# Patient Record
Sex: Male | Born: 1935 | Race: White | Hispanic: No | Marital: Single | Smoking: Former smoker
Health system: Southern US, Community
[De-identification: ages and names within clinical notes are randomized; demographics above are authoritative.]

## PROBLEM LIST (undated history)

## (undated) DIAGNOSIS — T8859XA Other complications of anesthesia, initial encounter: Secondary | ICD-10-CM

## (undated) DIAGNOSIS — N400 Enlarged prostate without lower urinary tract symptoms: Secondary | ICD-10-CM

## (undated) DIAGNOSIS — T4145XA Adverse effect of unspecified anesthetic, initial encounter: Secondary | ICD-10-CM

## (undated) DIAGNOSIS — T7840XA Allergy, unspecified, initial encounter: Secondary | ICD-10-CM

## (undated) DIAGNOSIS — I10 Essential (primary) hypertension: Secondary | ICD-10-CM

## (undated) DIAGNOSIS — J40 Bronchitis, not specified as acute or chronic: Secondary | ICD-10-CM

## (undated) DIAGNOSIS — E039 Hypothyroidism, unspecified: Secondary | ICD-10-CM

## (undated) DIAGNOSIS — H547 Unspecified visual loss: Secondary | ICD-10-CM

## (undated) HISTORY — PX: ELBOW FRACTURE SURGERY: SHX616

## (undated) HISTORY — DX: Hypothyroidism, unspecified: E03.9

## (undated) HISTORY — PX: RETINAL DETACHMENT SURGERY: SHX105

## (undated) HISTORY — DX: Benign prostatic hyperplasia without lower urinary tract symptoms: N40.0

## (undated) HISTORY — DX: Essential (primary) hypertension: I10

## (undated) HISTORY — PX: EYE SURGERY: SHX253

---

## 1999-04-27 HISTORY — PX: CATARACT EXTRACTION W/ INTRAOCULAR LENS  IMPLANT, BILATERAL: SHX1307

## 2000-07-06 ENCOUNTER — Encounter: Admission: RE | Admit: 2000-07-06 | Discharge: 2000-07-06 | Payer: Self-pay | Admitting: Family Medicine

## 2000-07-06 ENCOUNTER — Encounter: Payer: Self-pay | Admitting: Family Medicine

## 2001-08-28 ENCOUNTER — Encounter: Admission: RE | Admit: 2001-08-28 | Discharge: 2001-08-28 | Payer: Self-pay | Admitting: Family Medicine

## 2001-08-28 ENCOUNTER — Encounter: Payer: Self-pay | Admitting: Family Medicine

## 2007-09-04 ENCOUNTER — Encounter: Admission: RE | Admit: 2007-09-04 | Discharge: 2007-09-04 | Payer: Self-pay | Admitting: Family Medicine

## 2009-06-09 ENCOUNTER — Ambulatory Visit: Payer: Self-pay | Admitting: Family Medicine

## 2009-06-09 DIAGNOSIS — I10 Essential (primary) hypertension: Secondary | ICD-10-CM | POA: Insufficient documentation

## 2009-06-09 DIAGNOSIS — M109 Gout, unspecified: Secondary | ICD-10-CM | POA: Insufficient documentation

## 2009-06-10 ENCOUNTER — Telehealth: Payer: Self-pay | Admitting: Family Medicine

## 2009-06-10 LAB — CONVERTED CEMR LAB
ALT: 17 units/L (ref 0–53)
AST: 15 units/L (ref 0–37)
Albumin: 4.3 g/dL (ref 3.5–5.2)
Alkaline Phosphatase: 74 units/L (ref 39–117)
BUN: 17 mg/dL (ref 6–23)
CO2: 26 meq/L (ref 19–32)
Calcium: 9 mg/dL (ref 8.4–10.5)
Chloride: 103 meq/L (ref 96–112)
Cholesterol: 160 mg/dL (ref 0–200)
Creatinine, Ser: 1.14 mg/dL (ref 0.40–1.50)
Glucose, Bld: 89 mg/dL (ref 70–99)
HDL: 46 mg/dL (ref >39)
LDL Cholesterol: 103 mg/dL — ABNORMAL HIGH (ref 0–99)
PSA: 0.84 ng/mL (ref 0.10–4.00)
Potassium: 4.3 meq/L (ref 3.5–5.3)
Sodium: 143 meq/L (ref 135–145)
TSH: 0.808 microintl units/mL (ref 0.350–4.500)
Total Bilirubin: 0.4 mg/dL (ref 0.3–1.2)
Total CHOL/HDL Ratio: 3.5
Total Protein: 6.4 g/dL (ref 6.0–8.3)
Triglycerides: 54 mg/dL (ref <150)
Uric Acid, Serum: 9.8 mg/dL — ABNORMAL HIGH (ref 4.0–7.8)
VLDL: 11 mg/dL (ref 0–40)
Vit D, 25-Hydroxy: 18 ng/mL — ABNORMAL LOW (ref 30–89)
Vitamin B-12: 356 pg/mL (ref 211–911)

## 2009-06-30 ENCOUNTER — Ambulatory Visit: Payer: Self-pay | Admitting: Family Medicine

## 2009-08-27 ENCOUNTER — Telehealth (INDEPENDENT_AMBULATORY_CARE_PROVIDER_SITE_OTHER): Payer: Self-pay | Admitting: *Deleted

## 2009-08-30 ENCOUNTER — Encounter: Payer: Self-pay | Admitting: Family Medicine

## 2009-09-01 LAB — CONVERTED CEMR LAB: Vit D, 25-Hydroxy: 38 ng/mL (ref 30–89)

## 2009-11-10 ENCOUNTER — Ambulatory Visit: Payer: Self-pay | Admitting: Family Medicine

## 2009-11-11 LAB — CONVERTED CEMR LAB
BUN: 22 mg/dL (ref 6–23)
CO2: 27 meq/L (ref 19–32)
Calcium: 9.3 mg/dL (ref 8.4–10.5)
Chloride: 104 meq/L (ref 96–112)
Creatinine, Ser: 1.49 mg/dL (ref 0.40–1.50)
Glucose, Bld: 95 mg/dL (ref 70–99)
Potassium: 4.3 meq/L (ref 3.5–5.3)
Sodium: 141 meq/L (ref 135–145)
TSH: 1.357 microintl units/mL (ref 0.350–4.500)
Uric Acid, Serum: 10.5 mg/dL — ABNORMAL HIGH (ref 4.0–7.8)

## 2009-11-14 ENCOUNTER — Ambulatory Visit: Payer: Self-pay | Admitting: Family Medicine

## 2009-11-14 DIAGNOSIS — N139 Obstructive and reflux uropathy, unspecified: Secondary | ICD-10-CM | POA: Insufficient documentation

## 2009-11-14 LAB — CONVERTED CEMR LAB
Bilirubin Urine: NEGATIVE
Blood in Urine, dipstick: NEGATIVE
Glucose, Urine, Semiquant: NEGATIVE
Ketones, urine, test strip: NEGATIVE
Nitrite: NEGATIVE
Specific Gravity, Urine: 1.015
Urobilinogen, UA: 0.2
WBC Urine, dipstick: NEGATIVE
pH: 5

## 2009-11-15 ENCOUNTER — Encounter: Payer: Self-pay | Admitting: Family Medicine

## 2009-11-24 ENCOUNTER — Ambulatory Visit: Payer: Self-pay | Admitting: Family Medicine

## 2009-11-25 DIAGNOSIS — N183 Chronic kidney disease, stage 3 unspecified: Secondary | ICD-10-CM | POA: Insufficient documentation

## 2009-11-25 LAB — CONVERTED CEMR LAB
BUN: 19 mg/dL (ref 6–23)
CO2: 29 meq/L (ref 19–32)
Calcium: 8.9 mg/dL (ref 8.4–10.5)
Chloride: 100 meq/L (ref 96–112)
Creatinine, Ser: 1.65 mg/dL — ABNORMAL HIGH (ref 0.40–1.50)
Glucose, Bld: 118 mg/dL — ABNORMAL HIGH (ref 70–99)
Potassium: 3.8 meq/L (ref 3.5–5.3)
Sodium: 140 meq/L (ref 135–145)

## 2009-11-26 ENCOUNTER — Telehealth (INDEPENDENT_AMBULATORY_CARE_PROVIDER_SITE_OTHER): Payer: Self-pay | Admitting: *Deleted

## 2009-11-26 ENCOUNTER — Encounter: Payer: Self-pay | Admitting: Family Medicine

## 2009-11-27 ENCOUNTER — Telehealth (INDEPENDENT_AMBULATORY_CARE_PROVIDER_SITE_OTHER): Payer: Self-pay | Admitting: *Deleted

## 2009-12-01 ENCOUNTER — Emergency Department (HOSPITAL_COMMUNITY)
Admission: EM | Admit: 2009-12-01 | Discharge: 2009-12-01 | Payer: Self-pay | Source: Home / Self Care | Admitting: Emergency Medicine

## 2009-12-05 ENCOUNTER — Telehealth (INDEPENDENT_AMBULATORY_CARE_PROVIDER_SITE_OTHER): Payer: Self-pay | Admitting: *Deleted

## 2009-12-05 ENCOUNTER — Ambulatory Visit: Payer: Self-pay | Admitting: Family Medicine

## 2009-12-05 DIAGNOSIS — N138 Other obstructive and reflux uropathy: Secondary | ICD-10-CM | POA: Insufficient documentation

## 2009-12-05 DIAGNOSIS — N401 Enlarged prostate with lower urinary tract symptoms: Secondary | ICD-10-CM

## 2009-12-08 ENCOUNTER — Encounter: Payer: Self-pay | Admitting: Family Medicine

## 2009-12-12 ENCOUNTER — Ambulatory Visit: Payer: Self-pay | Admitting: Family Medicine

## 2009-12-12 DIAGNOSIS — E538 Deficiency of other specified B group vitamins: Secondary | ICD-10-CM

## 2009-12-26 ENCOUNTER — Ambulatory Visit
Admission: RE | Admit: 2009-12-26 | Discharge: 2009-12-26 | Payer: Self-pay | Source: Home / Self Care | Admitting: Urology

## 2009-12-26 ENCOUNTER — Ambulatory Visit: Payer: Self-pay | Admitting: Vascular Surgery

## 2009-12-31 ENCOUNTER — Telehealth: Payer: Self-pay | Admitting: Family Medicine

## 2010-05-26 NOTE — Progress Notes (Signed)
Summary: renal u/s  Phone Note Outgoing Call   Summary of Call: Pls let pt know that his renal u/s shows a small kidney syst with normal sized kidneys and a mildly enlarged prostate gland.  Will fax copy to urology for upcoming visit.   Initial call taken by: [name],  November 26, 2009 3:50 PM  Follow-up for Phone Call        [facility] informing Pt of the above Follow-up by: [name],  November 26, 2009 3:58 PM

## 2010-05-26 NOTE — Assessment & Plan Note (Signed)
Summary: NOV HTN/ labs   Vital Signs:  Patient profile:   77 year old male Height:      67 inches Weight:      188 pounds BMI:     29.55 O2 Sat:      97 % on Room air Temp:     97.7 degrees F oral Pulse rate:   82 / minute BP sitting:   145 / 89  (left arm) Cuff size:   regular  Vitals Entered By: [name] (June 09, 2009 10:37 AM)  O2 Flow:  Room air CC: New to est.    Primary Care Provider:  [name]  CC:  New to est. .  History of Present Illness: 77 yo WM presents for NOV.  He has a hx of HTN and hypothyroidism x 20 yrs.  He is doing well on Armour Thyroid.  His TSH is due.  He takes Lasix (branded) for leg swelling as needed.  No hx of CHF.  He needs RF on his AtacandHCT.  Denies any heart problems.  His fasting labs are due.  He has hx of low B12 and low Vit D.      Current Medications (verified): 1)  Armour Thyroid 60 Mg Tabs (Thyroid) .... Take 1 Tab By Mouth Once Daily 2)  Lasix 40 Mg Tabs (Furosemide) .... Take As Directed As Needed 3)  Atacand Hct 32-12.5 Mg Tabs (Candesartan Cilexetil-Hctz) .... Take 1 Tab By Mouth Once Daily 4)  Potassium Chloride Cr 10 Meq Cr-Tabs (Potassium Chloride) .... Take As Directed As Needed W/ Lasix  Allergies (verified): 1)  ! * Ivp Dye  Past History:  Past Medical History: hypothyroidism HTN  Past Surgical History: ruptured viscous bilat 1994, 2001  Family History: brother healthy sister DM father and mother died of heart dz > 13 yo.  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 Denies ETOH. No regular exercise.   Review of Systems       no fevers/sweats/weakness, unexplained wt loss/gain, no change in vision, no difficulty hearing, ringing in ears, no hay fever/allergies, no CP/discomfort, no palpitations, no breast lump/nipple discharge, no cough/wheeze, no blood in stool, no N/V/D, no nocturia, no leaking urine, no unusual vag bleeding, no  vaginal/penile discharge, no muscle/joint pain, no rash, no new/changing mole, no HA, no memory loss, no anxiety, no sleep problem, no depression, no unexplained lumps, no easy bruising/bleeding, no concern with sexual function.   Physical Exam  General:  alert, well-developed, well-nourished, and well-hydrated.   Head:  normocephalic and atraumatic.   Eyes:  s/p surgical lens implant Ears:  no external deformities.   Nose:  no nasal discharge.   Mouth:  pharynx pink and moist and fair dentition.   Neck:  no masses.   Lungs:  Normal respiratory effort, chest expands symmetrically. Lungs are clear to auscultation, no crackles or wheezes. Heart:  normal rate, regular rhythm, and no murmur.   Abdomen:  soft, non-tender, and normal bowel sounds. no AA bruits Extremities:  1+ pitting LE edema bilat Skin:  color normal.   Cervical Nodes:  No lymphadenopathy noted Psych:  good eye contact, not anxious appearing, and not depressed appearing.     Impression & Recommendations:  Problem # 1:  ESSENTIAL HYPERTENSION, BENIGN (ICD-401.1) BP high today but he claims to be 'at goal' at home.  Continue current meds.  He takes Lasix only as needed for leg swelling. Will get old  records to see if he has had an ECHO. His updated medication list for this problem includes:    Lasix 40 Mg Tabs (Furosemide) .Marland Kitchen... Take as directed as needed    Atacand Hct 32-12.5 Mg Tabs (Candesartan cilexetil-hctz) .Marland Kitchen... Take 1 tab by mouth once daily  Orders: T-Comprehensive Metabolic Panel ([id number])  BP today: 145/89  Problem # 2:  UNSPECIFIED HYPOTHYROIDISM (ICD-244.9) Update TSH and adjust and RF thyroid meds tomorrow. His updated medication list for this problem includes:    Armour Thyroid 60 Mg Tabs (Thyroid) .Marland Kitchen... Take 1 tab by mouth once daily  Orders: T-TSH ([id number])  Problem # 3:  FATIGUE (ICD-780.79) Update labs today. Orders: T-Vitamin B12 [id number]) T-Vitamin D (25-Hydroxy)  [id number])  Problem # 4:  GOUT, UNSPECIFIED (ICD-274.9) Hx of gout but declined going back on Allopurinol.   Orders: T-Uric Acid (Blood) [id number])  Complete Medication List: 1)  Armour Thyroid 60 Mg Tabs (Thyroid) .... Take 1 tab by mouth once daily 2)  Lasix 40 Mg Tabs (Furosemide) .... Take as directed as needed 3)  Atacand Hct 32-12.5 Mg Tabs (Candesartan cilexetil-hctz) .... Take 1 tab by mouth once daily 4)  Potassium Chloride Cr 10 Meq Cr-tabs (Potassium chloride) .... Take as directed as needed w/ lasix  Other Orders: T-PSA Total (Medicare Screen Only) [id number]) T-Lipid Profile [id number])  Patient Instructions: 1)  Labs today. 2)  Will call you w/ results tomorrow. 3)  Atacand RFd. 4)  Follow up in 6 mos. Prescriptions: ATACAND HCT 32-12.5 MG TABS (CANDESARTAN CILEXETIL-HCTZ) Take 1 tab by mouth once daily  #30 x 3   Entered and Authorized by:   [name]   Signed by:   [name] on 06/09/2009   Method used:   Electronically to        [facility]* (retail)       [address]       [place], [place]  [address]       Ph: [phone]       Fax: [phone]   RxID:   [id number]

## 2010-05-26 NOTE — Assessment & Plan Note (Signed)
Summary: TROUBLE URINATING/   Vital Signs:  Patient profile:   77 year old male Height:      67 inches Weight:      185 pounds BMI:     29.08 O2 Sat:      96 % on Room air Pulse rate:   89 / minute BP sitting:   101 / 62  (left arm) Cuff size:   large  Vitals Entered By: [name] (November 14, 2009 2:52 PM)  O2 Flow:  Room air CC: Unable to urinate well x 4 days.    Primary Care Provider:  [name]  CC:  Unable to urinate well x 4 days. Marland Kitchen  History of Present Illness: 77 yo WM presents for problems getting urine to pass over the past 4 days, getting worse.  He has tried to take Lasix but that didn't help either.  His last output was this AM.  He has voided only a tiny amount first thing this AM.      He does not feel like he has to void today.  He has been drinking cranberry juice, water and lemonade.  He has no swelling in his legs.  He is not taking any cold or allergy meds.  Denies any abdominal pain.      Current Medications (verified): 1)  Armour Thyroid 60 Mg Tabs (Thyroid) .... Take 1 Tab By Mouth Once Daily 2)  Lasix 40 Mg Tabs (Furosemide) .... Take As Directed As Needed 3)  Losartan Potassium-Hctz 100-12.5 Mg Tabs (Losartan Potassium-Hctz) .Marland Kitchen.. 1 Tab By Mouth Daily 4)  Potassium Chloride Cr 10 Meq Cr-Tabs (Potassium Chloride) .... Take As Directed As Needed W/ Lasix  Allergies (verified): 1)  ! * Ivp Dye  Past History:  Past Medical History: Reviewed history from 11/10/2009 and no changes required. hypothyroidism HTN gout  Past Surgical History: Reviewed history from 06/09/2009 and no changes required. ruptured viscous bilat 1994, 2001  Family History: Reviewed history from 06/09/2009 and no changes required. brother healthy sister DM father and mother died of heart dz > 73 yo.  Social History: Reviewed history from 06/09/2009 and no changes required. 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 Denies  ETOH. No regular exercise.   Review of Systems      See HPI  Physical Exam  General:  alert, well-developed, well-nourished, and well-hydrated.   Head:  normocephalic and atraumatic.   Eyes:  conjunctiva clear Neck:  no masses.   Lungs:  normal respiratory effort, no intercostal retractions, and no accessory muscle use.   Heart:  normal rate, regular rhythm, and no murmur.   Abdomen:  soft, non-tender, normal bowel sounds, no distention, and no guarding.  no CVAT Skin:  color normal.   Psych:  good eye contact, not anxious appearing, and not depressed appearing.     Impression & Recommendations:  Problem # 1:  URINARY OBSTRUCTION UNSPECIFIED (ICD-599.60) I&O cath performed to empty bladder (performed by Urgent care nursing staff).  Urine specimen collected for UA and cx. Will empirically start on Cipro + Flomax for BPH.  he is not taking any meds with anti cholinergic SEs.  He is to stop the Lasix as his BP is a little low and he is probably dehydrated.  Work on increasing water intake and RTC in 1 wk to recheck.  His lst Cr was 1.49.  Will need this repeated.   Orders: Urinalysis-dipstick only (Medicare patient) [id number]) T-Culture, Urine ([id number])  Complete Medication List: 1)  Armour Thyroid 60 Mg Tabs (Thyroid) .... Take 1 tab by mouth once daily 2)  Lasix 40 Mg Tabs (Furosemide) .... Take as directed as needed 3)  Losartan Potassium-hctz 100-12.5 Mg Tabs (Losartan potassium-hctz) .Marland Kitchen.. 1 tab by mouth daily 4)  Potassium Chloride Cr 10 Meq Cr-tabs (Potassium chloride) .... Take as directed as needed w/ lasix 5)  Flomax 0.4 Mg Caps (Tamsulosin hcl) .Marland Kitchen.. 1 capsule by mouth qpm 6)  Cipro 500 Mg Tabs (Ciprofloxacin hcl) .Marland Kitchen.. 1 tab by mouth two times a day x 7 days  Patient Instructions: 1)  For urinary obstruction: 2)  Take Cipro 500 mg 2 x a day x 7 days. 3)  Use Flomax 1 tab in the evening. 4)  Will call you with urine culture results next wk. 5)  Return for prostate  check/ BMP in 5 days. Prescriptions: CIPRO 500 MG TABS (CIPROFLOXACIN HCL) 1 tab by mouth two times a day x 7 days  #14 x 0   Entered and Authorized by:   [name]   Signed by:   [name] on 11/14/2009   Method used:   Electronically to        [facility]* (retail)       [address]       [place], [place]  [address]       Ph: [phone]       Fax: [phone]   RxID:   [id number] FLOMAX 0.4 MG CAPS (TAMSULOSIN HCL) 1 capsule by mouth qPM  #30 x 0   Entered and Authorized by:   [name]   Signed by:   [name] on 11/14/2009   Method used:   Electronically to        [facility]* (retail)       [address]       [place], [place]  [address]       Ph: [phone]       Fax: [phone]   RxID:   [id number] CIPRO 500 MG TABS (CIPROFLOXACIN HCL) 1 tab by mouth two times a day x 7 days  #14 x 0   Entered and Authorized by:   [name]   Signed by:   [name] on 11/14/2009   Method used:   Electronically to        [facility]* (retail)       [address]       [place], [place]  [address]       Ph: [phone]       Fax: [phone]   RxID:   [id number] FLOMAX 0.4 MG CAPS (TAMSULOSIN HCL) 1 capsule by mouth qPM  #30 x 2   Entered and Authorized by:   [name]   Signed by:   [name] on 11/14/2009   Method used:   Electronically to        [facility]* (retail)       [address]       [place], [place]  [address]       Ph: [phone]       Fax: [phone]   RxID:   [id number]   Laboratory Results   Urine Tests  Date/Time Received: 11/14/2009 Date/Time Reported: 11/14/2009  Routine Urinalysis   Color: yellow Appearance: Clear Glucose: negative   (Normal Range: Negative) Bilirubin: negative   (Normal Range: Negative) Ketone: negative   (Normal Range: Negative) Spec. Gravity: 1.015   (  Normal Range: 1.003-1.035) Blood: negative   (Normal Range: Negative) pH: 5.0   (Normal Range:  5.0-8.0) Protein: trace   (Normal Range: Negative) Urobilinogen: 0.2   (Normal Range: 0-1) Nitrite: negative   (Normal Range: Negative) Leukocyte Esterace: negative   (Normal Range: Negative)

## 2010-05-26 NOTE — Assessment & Plan Note (Signed)
Summary: BP check  Nurse Visit   Vital Signs:  Patient profile:   77 year old male O2 Sat:      99 % on Room air Pulse rate:   87 / minute BP sitting:   119 / 77  (left arm) Cuff size:   regular  O2 Flow:  Room air  Allergies: 1)  ! * Ivp Dye  Orders Added: 1)  Est. Patient Level I [[address]]   CC: Follow-up HTN HPI: Taking meds? yes   Side effects? no Chest pain, SOB, Dizziness? no A/P: HTN (401.1) At goal? If no, Patient will be notified.  5 minutes was spent with patient.   Impression & Recommendations:  Problem # 1:  ESSENTIAL HYPERTENSION, BENIGN (ICD-401.1) Assessment Improved Continue current meds. His updated medication list for this problem includes:    Lasix 40 Mg Tabs (Furosemide) .Marland Kitchen... Take as directed as needed    Losartan Potassium-hctz 100-12.5 Mg Tabs (Losartan potassium-hctz) .Marland Kitchen... 1 tab by mouth daily  Orders: Est. Patient Level I ([address])  BP today: 119/77 Prior BP: 145/89 (06/09/2009)  Labs Reviewed: K+: 4.3 (06/09/2009) Creat: : 1.14 (06/09/2009)   Chol: 160 (06/09/2009)   HDL: 46 (06/09/2009)   LDL: 103 (06/09/2009)   TG: 54 (06/09/2009)  Complete Medication List: 1)  Armour Thyroid 60 Mg Tabs (Thyroid) .... Take 1 tab by mouth once daily 2)  Lasix 40 Mg Tabs (Furosemide) .... Take as directed as needed 3)  Losartan Potassium-hctz 100-12.5 Mg Tabs (Losartan potassium-hctz) .Marland Kitchen.. 1 tab by mouth daily 4)  Potassium Chloride Cr 10 Meq Cr-tabs (Potassium chloride) .... Take as directed as needed w/ lasix 5)  Vitamin D 50,000 Iu  .Marland Kitchen.. 1 capsule by mouth once a wk

## 2010-05-26 NOTE — Progress Notes (Signed)
Summary: Atacand too expensive  Phone Note Call from Patient   Caller: Patient Summary of Call: Pt can not afford Atacand. Pt would like changed. Please advise.  Initial call taken by: [name],  June 10, 2009 10:13 AM  Follow-up for Phone Call        changed to Losartan - HCTZ. REturn for a nurse BP check in 3 wks. Follow-up by: [name],  June 10, 2009 10:40 AM    New/Updated Medications: LOSARTAN POTASSIUM-HCTZ 100-12.5 MG TABS (LOSARTAN POTASSIUM-HCTZ) 1 tab by mouth daily Prescriptions: LOSARTAN POTASSIUM-HCTZ 100-12.5 MG TABS (LOSARTAN POTASSIUM-HCTZ) 1 tab by mouth daily  #30 x 1   Entered and Authorized by:   [name]   Signed by:   [name] on 06/10/2009   Method used:   Electronically to        [facility]* (retail)       [address]       [place], [place]  [address]       Ph: [phone]       Fax: [phone]   RxID:   [id number]   Appended Document: Atacand too expensive Pt aware. Apt scheduled

## 2010-05-26 NOTE — Assessment & Plan Note (Signed)
Summary: BPH f/u   Vital Signs:  Patient profile:   77 year old male Height:      67 inches Weight:      188 pounds BMI:     29.55 O2 Sat:      96 % on Room air Pulse rate:   95 / minute BP sitting:   132 / 76  (left arm) Cuff size:   large  Vitals Entered By: [name] (November 24, 2009 10:14 AM)  O2 Flow:  Room air CC: F/U prostate. Also c/o congestion and cough x 1 week.   Primary Care Provider:  [name]  CC:  F/U prostate. Also c/o congestion and cough x 1 week.Brent Morrison  History of Present Illness: 77 yo WM presents for f/u voiding problems from 1 wk ago from BPH.  He filled #8 of Flomax which did help.  He did take Cipro even though his Urine Cx was negative.    He has been able to void without problems since his visit although he is taking Lasix.  he has had some leg swelling.    He has had some chest congestion for a few days.  Denies any sore throat or runny nose.  Not taking anything.  Denies any SOB.  Cough is mostly dry.  He is not taking anything OTC.       Allergies (verified): 1)  ! * Ivp Dye  Past History:  Past Medical History: Reviewed history from 11/10/2009 and no changes required. hypothyroidism HTN gout  Past Surgical History: Reviewed history from 06/09/2009 and no changes required. ruptured viscous bilat 1994, 2001  Social History: Reviewed history from 06/09/2009 and no changes required. 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 Denies ETOH. No regular exercise.   Review of Systems      See HPI  Physical Exam  General:  alert, well-developed, well-nourished, and well-hydrated.   Head:  normocephalic and atraumatic.   Eyes:  sclera non icteric Mouth:  pharynx pink and moist.   Neck:  no masses.   Lungs:  normal respiratory effort, no intercostal retractions, and no accessory muscle use.  dry hacking cough Heart:  normal rate, regular rhythm, and no murmur.   Prostate:  no nodules, no  asymmetry, boggy, and 2+ enlarged.nontender   Skin:  color normal.   Psych:  good eye contact, not anxious appearing, and not depressed appearing.     Impression & Recommendations:  Problem # 1:  URINARY OBSTRUCTION UNSPECIFIED (ICD-599.60) Assessment Improved Needing I&O cath last wk.  Urine cx was neg.  Prostate exam today - 2+ enlarged, non tender.  Had a normal PSA this year. Will start him on Cardura instead of Flomax due to cost.  This should also help his BP. Avoid anticholinergics and avoid overuse of Lasix to "force urination' which he has been doing. He may have experienced dehydration with a drop in SBP which lead to anuric state last wk. Refer to urology if this recurs.  Problem # 2:  CHRONIC KIDNEY DISEASE STAGE II (MILD) (ICD-585.2) Cr 1.49 2 wks ago likely due to HTN and use of HCTZ with Lasix.  I stopped his HCTZ and recommended alternative treatments for leg edema such has elevation and compression socks.   Orders: T-Basic Metabolic Panel [id number])  Labs Reviewed: BUN: 22 (11/10/2009)   Cr: 1.49 (11/10/2009)    Ca++: 9.3 (11/10/2009)    TP: 6.4 (06/09/2009)   Alb: 4.3 (06/09/2009)  Problem # 3:  ACUTE BRONCHITIS (ICD-466.0) Will treat with Zithromax x 5 days + plain Guifenasin x 1 wk.  Call if not seeing improvements or getting worse. His updated medication list for this problem includes:    Zithromax Z-pak 250 Mg Tabs (Azithromycin) .Brent Morrison... 2 tabs by mouth x 1 day then 1 tab by mouth daily x 4 days  Problem # 4:  ESSENTIAL HYPERTENSION, BENIGN (ICD-401.1) BP had been running low with Losartan - HCTZ + Lasix and his CR has risen to 1.49.  I changed him to plain Losartan.  Recheck BMP today and RTC for f/u BP in 2 mos.  Limit Lasix to 2-3 x a wk. His updated medication list for this problem includes:    Lasix 40 Mg Tabs (Furosemide) .Brent Morrison... 1 tab by mouth once daily as needed for leg swelling    Losartan Potassium 100 Mg Tabs (Losartan potassium) .Brent Morrison... 1 tab by mouth  daily    Cardura 1 Mg Tabs (Doxazosin mesylate) .Brent Morrison... 1 tab by mouth qhs  BP today: 132/76 Prior BP: 101/62 (11/14/2009)  Labs Reviewed: K+: 4.3 (11/10/2009) Creat: : 1.49 (11/10/2009)   Chol: 160 (06/09/2009)   HDL: 46 (06/09/2009)   LDL: 103 (06/09/2009)   TG: 54 (06/09/2009)  Complete Medication List: 1)  Armour Thyroid 60 Mg Tabs (Thyroid) .... Take 1 tab by mouth once daily 2)  Lasix 40 Mg Tabs (Furosemide) .Brent Morrison.. 1 tab by mouth once daily as needed for leg swelling 3)  Losartan Potassium 100 Mg Tabs (Losartan potassium) .Brent Morrison.. 1 tab by mouth daily 4)  Potassium Chloride Cr 10 Meq Cr-tabs (Potassium chloride) .... Take as directed as needed w/ lasix 5)  Cardura 1 Mg Tabs (Doxazosin mesylate) .Brent Morrison.. 1 tab by mouth qhs 6)  Zithromax Z-pak 250 Mg Tabs (Azithromycin) .... 2 tabs by mouth x 1 day then 1 tab by mouth daily x 4 days  Patient Instructions: 1)  For bronchitis: take 5 days of Zithromax + plain Robitussin for the next wk. 2)  For enlarged prostate: 3)  Change Flomax to Cardura, 1 tab at night. 4)  Use Lasix up to 2 x a wk for leg swelling. 5)  Use compression socks and elevate legs to also help. 6)  Change Losartan HCTZ to plain losartan. 7)  Check BMP today.  Will call you w/ results tomorrow. 8)  Return for f/u BP/ BPH in 2 mos. Prescriptions: ZITHROMAX Z-PAK 250 MG TABS (AZITHROMYCIN) 2 tabs by mouth x 1 day then 1 tab by mouth daily x 4 days  #1 pack x 0   Entered and Authorized by:   [name]   Signed by:   [name] on 11/24/2009   Method used:   Electronically to        [facility]* (retail)       [address]       [place], [place]  [address]       Ph: [phone]       Fax: [phone]   RxID:   [id number] LASIX 40 MG TABS (FUROSEMIDE) 1 tab by mouth once daily as needed for leg swelling  #30 x 1   Entered and Authorized by:   [name]   Signed by:   [name] on 11/24/2009   Method used:   Electronically to         [facility]* (retail)       [address]  [place], [place]  [address]       Ph: [phone]       Fax: [phone]   RxID:   [id number] LOSARTAN POTASSIUM 100 MG TABS (LOSARTAN POTASSIUM) 1 tab by mouth daily  #30 x 3   Entered and Authorized by:   [name]   Signed by:   [name] on 11/24/2009   Method used:   Electronically to        [facility]* (retail)       [address]       [place], [place]  [address]       Ph: [phone]       Fax: [phone]   RxID:   [id number] CARDURA 1 MG TABS (DOXAZOSIN MESYLATE) 1 tab by mouth qhs  #30 x 3   Entered and Authorized by:   [name]   Signed by:   [name] on 11/24/2009   Method used:   Electronically to        [facility]* (retail)       [address]       [place], [place]  [address]       Ph: [phone]       Fax: [phone]   RxID:   [id number]

## 2010-05-26 NOTE — Progress Notes (Signed)
Summary: HH update  Phone Note From Other Clinic Call back at [phone]   Caller: Jacki Cones with [facility] Call For: [place] Summary of Call: [facility] went to patient home and was told by care giver Flossie Dibble that patient was doing fine and they did not need there services. Just FYI that [facility] would not be seeing this gentleman for in home care needs Initial call taken by: Kathlene November,  December 31, 2009 1:06 PM  Follow-up for Phone Call        OK...this was mostly for catheter needs.  I am assuming he doesn't need this anymore. Follow-up by: [name],  January 01, 2010 10:18 AM

## 2010-05-26 NOTE — Consult Note (Signed)
Summary: [facility]  [facility]   Imported By: Lanelle Bal 12/16/2009 12:53:56  _____________________________________________________________________  External Attachment:    Type:   Image     Comment:   External Document

## 2010-05-26 NOTE — Progress Notes (Signed)
Summary: pt has a question  Phone Note Call from Patient   Caller: Patient Summary of Call: Pt. is calling to get some information from you about the urologist... Call him back at [phone] Initial call taken by: Michaelle Copas,  December 05, 2009 2:52 PM  Follow-up for Phone Call        [facility] to discuss but didnt get an answer Follow-up by: [name],  December 05, 2009 4:10 PM     Appended Document: pt has a question Make sure pt knows to take FLOMAX (TAMSILOSIN) AND NOT CARDURA. HE IS TO STAY ON THE AVODART ALSO PER DR Aldean Ast.  Seymour Bars, D.O.  Appended Document: pt has a question Pt aware of the above. I printed out a copy and explained to Pt face to face. I also gave Pt a copy incase he forgot. A new Rx for avodart was printed  and given to Pt. Pt thinks he needs to be admitted to the hospital to have surgery. Pt did not seem to understand that ED visit/hosp admission was not indicated at this time. I explained the importance of taking medications exactly the way the way they are Rxd. I did tell Pt before he left that if he felt at any time he needed immediate med attention he should go to ED.

## 2010-05-26 NOTE — Assessment & Plan Note (Signed)
Summary: f/u BP/ gout   Vital Signs:  Patient profile:   77 year old male Height:      67 inches Weight:      188 pounds BMI:     29.55 O2 Sat:      98 % on Room air Pulse rate:   80 / minute BP sitting:   108 / 69  (left arm) Cuff size:   regular  Vitals Entered By: [name] (November 10, 2009 9:58 AM)  O2 Flow:  Room air CC: F/U   Primary Care Provider:  [name]  CC:  F/U.  History of Present Illness: 77 yo WM presents for f/u HTN and hypothyroidism.    He is due for a TSH and BMP today.  He is rarely needing Lasix.  He has decided to stay off Allopurinol for gout and is having some L knee pain but w/o redness or swelling.  He is not taking any Advil or Tylenol for pain.  He is doing well on Losartan /HCTZ for HTN.  Denies any CP or DOE.  Rarely having any leg swelling.  Has been rather tired even on Armour Thyroid.  His f/u Vit D level in May was normal and he's taking 1000 International Units/ day.  His B12 was low normal in Jan.      Current Medications (verified): 1)  Armour Thyroid 60 Mg Tabs (Thyroid) .... Take 1 Tab By Mouth Once Daily 2)  Lasix 40 Mg Tabs (Furosemide) .... Take As Directed As Needed 3)  Losartan Potassium-Hctz 100-12.5 Mg Tabs (Losartan Potassium-Hctz) .Marland Kitchen.. 1 Tab By Mouth Daily 4)  Potassium Chloride Cr 10 Meq Cr-Tabs (Potassium Chloride) .... Take As Directed As Needed W/ Lasix  Allergies (verified): 1)  ! * Ivp Dye  Past History:  Past Medical History: hypothyroidism HTN gout  Past Surgical History: Reviewed history from 06/09/2009 and no changes required. ruptured viscous bilat 1994, 2001  Family History: Reviewed history from 06/09/2009 and no changes required. brother healthy sister DM father and mother died of heart dz > 86 yo.  Social History: Reviewed history from 06/09/2009 and no changes required. 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 Denies ETOH. No regular exercise.    Review of Systems      See HPI  Physical Exam  General:  alert, well-developed, well-nourished, and well-hydrated.   Head:  normocephalic and atraumatic.   Eyes:  PERRLA Mouth:  pharynx pink and moist.   Neck:  no masses.   Lungs:  Normal respiratory effort, chest expands symmetrically. Lungs are clear to auscultation, no crackles or wheezes. Heart:  normal rate, regular rhythm, and no murmur.   Msk:  no joint tenderness, no joint swelling, no joint warmth, and no redness over joints.   Extremities:  no LE edema Neurologic:  gait normal.   Skin:  color normal.     Impression & Recommendations:  Problem # 1:  ESSENTIAL HYPERTENSION, BENIGN (ICD-401.1) At goal.  Rarely using Lasix.  RF meds. His updated medication list for this problem includes:    Lasix 40 Mg Tabs (Furosemide) .Marland Kitchen... Take as directed as needed    Losartan Potassium-hctz 100-12.5 Mg Tabs (Losartan potassium-hctz) .Marland Kitchen... 1 tab by mouth daily  Orders: T-Basic Metabolic Panel ([id number])  BP today: 108/69 Prior BP: 119/77 (06/30/2009)  Labs Reviewed: K+: 4.3 (06/09/2009) Creat: : 1.14 (06/09/2009)   Chol: 160 (06/09/2009)   HDL: 46 (06/09/2009)   LDL: 103 (06/09/2009)  TG: 54 (06/09/2009)  Problem # 2:  UNSPECIFIED HYPOTHYROIDISM (ICD-244.9) Recheck TSH today. His updated medication list for this problem includes:    Armour Thyroid 60 Mg Tabs (Thyroid) .Marland Kitchen... Take 1 tab by mouth once daily  Orders: T-TSH ([phone])  Labs Reviewed: TSH: 0.808 (06/09/2009)    Chol: 160 (06/09/2009)   HDL: 46 (06/09/2009)   LDL: 103 (06/09/2009)   TG: 54 (06/09/2009)  Problem # 3:  FATIGUE (ICD-780.79)  Checking BMP, TSH today.  Had borderline low B12 in Jan.  Will do a B12 injection today to boost levels to mid normal range and see if fatigue improves.  He is to stay on Vit D 1000 International Units/ day.    Recheck B12 level in 4 mos aftter monthly injections.  Orders: Vit B12 1000 mcg (J3420) Admin of  Therapeutic Inj  intramuscular or subcutaneous (09323)  Problem # 4:  GOUT, UNSPECIFIED (ICD-274.9) Hx of gout but refuses to take NSAIDs, Allopurinol or colchicine.  Had a high UA of 9.8 in Jan.  Having L knee pain which is either gouty arthritis vs osteoarthritis.  he requests we recheck his UA today but it will not change managment since he is refusing to take anything.   Orders: T-Uric Acid (Blood) [id number])  Complete Medication List: 1)  Armour Thyroid 60 Mg Tabs (Thyroid) .... Take 1 tab by mouth once daily 2)  Lasix 40 Mg Tabs (Furosemide) .... Take as directed as needed 3)  Losartan Potassium-hctz 100-12.5 Mg Tabs (Losartan potassium-hctz) .Marland Kitchen.. 1 tab by mouth daily 4)  Potassium Chloride Cr 10 Meq Cr-tabs (Potassium chloride) .... Take as directed as needed w/ lasix  Patient Instructions: 1)  Return every month for B12 injection (nurse visit). 2)  REcheck B12 level in 4 mos (NOV). 3)  Labs today. 4)  Will call you w/ results. 5)  Try [facility] or a knee sleeve for arthritis pain. Prescriptions: LOSARTAN POTASSIUM-HCTZ 100-12.5 MG TABS (LOSARTAN POTASSIUM-HCTZ) 1 tab by mouth daily  #100 x 1   Entered and Authorized by:   [name]   Signed by:   [name] on 11/10/2009   Method used:   Electronically to        [facility]* (retail)       [address]       [place], [place]  [address]       Ph: [phone]       Fax: [phone]   RxID:   [id number]    Medication Administration  Injection # 1:    Medication: Vit B12 1000 mcg    Diagnosis: FATIGUE (ICD-780.79)    Route: IM    Site: L deltoid    Exp Date: 07/2011    Lot #: [id number]    Patient tolerated injection without complications    Given by: [name] (November 10, 2009 10:50 AM)  Orders Added: 1)  T-Basic Metabolic Panel [phone] 2)  T-TSH [[id number]] 3)  T-Uric Acid (Blood) [[id number]] 4)  Vit B12 1000 mcg [J3420] 5)  Admin of Therapeutic Inj  intramuscular or  subcutaneous [96372] 6)  Est. Patient Level IV [44315]

## 2010-05-26 NOTE — Assessment & Plan Note (Signed)
Summary: B12 shot- jr  Nurse Visit   Vitals Entered By: [name] (December 12, 2009 3:03 PM)  Allergies: 1)  ! * Ivp Dye  Medication Administration  Injection # 1:    Medication: Vit B12 1000 mcg    Diagnosis: FATIGUE (ICD-780.79)    Route: IM    Site: R deltoid    Exp Date: 07/2011    Lot #: [id number]    Given by: [name] (December 12, 2009 3:04 PM)  Orders Added: 1)  Vit B12 1000 mcg [J3420] 2)  Admin of Therapeutic Inj  intramuscular or subcutaneous [96372]   Medication Administration  Injection # 1:    Medication: Vit B12 1000 mcg    Diagnosis: FATIGUE (ICD-780.79)    Route: IM    Site: R deltoid    Exp Date: 07/2011    Lot #: [id number]    Given by: [name] (December 12, 2009 3:04 PM)  Orders Added: 1)  Vit B12 1000 mcg [J3420] 2)  Admin of Therapeutic Inj  intramuscular or subcutaneous [03474]

## 2010-05-26 NOTE — Assessment & Plan Note (Signed)
Summary: BPH/ GOUT   Vital Signs:  Patient profile:   77 year old male Height:      67 inches Weight:      185 pounds BMI:     29.08 O2 Sat:      96 % on Room air Temp:     97.9 degrees F oral Pulse rate:   96 / minute BP sitting:   135 / 84  (left arm) Cuff size:   large  Vitals Entered By: [name] (December 05, 2009 11:13 AM)  O2 Flow:  Room air CC: C/o gout flare both feet. Pt also requests cath be removed.   Primary Care Coulter Oldaker:  [name]  CC:  C/o gout flare both feet. Pt also requests cath be removed.Marland Kitchen  History of Present Illness: 77 yo WM presents for recheck -- BPH with obstruction, now with an indwelling foley placed in the ED last wk.  He is due to have it changed out today.  He has had some subjective fevers and fatiuge and is concerned about infection since he is OFF Cipro now.  He is taking Cardura (or leftover Flomax) for BPH.  He has an initial Urology appt at the end of the month.  he has never had a TURP.  He has had a mild elvation in his CR.  He is also having an acute gouty flare up in his is R foot which is very painful and causing swelling.  He has refused meds for this in the past even after talking aboiut his uric acid level of 10 a few mos ago.    Current Medications (verified): 1)  Armour Thyroid 60 Mg Tabs (Thyroid) .... Take 1 Tab By Mouth Once Daily 2)  Lasix 40 Mg Tabs (Furosemide) .Marland Kitchen.. 1 Tab By Mouth Once Daily As Needed For Leg Swelling 3)  Losartan Potassium 100 Mg Tabs (Losartan Potassium) .Marland Kitchen.. 1 Tab By Mouth Daily 4)  Potassium Chloride Cr 10 Meq Cr-Tabs (Potassium Chloride) .... Take As Directed As Needed W/ Lasix 5)  Cardura 1 Mg Tabs (Doxazosin Mesylate) .Marland Kitchen.. 1 Tab By Mouth Qhs  Allergies (verified): 1)  ! * Ivp Dye  Past History:  Past Medical History: hypothyroidism HTN gout BPH  Past Surgical History: Reviewed history from 06/09/2009 and no changes required. ruptured viscous bilat 1994, 2001  Family  History: Reviewed history from 06/09/2009 and no changes required. brother healthy sister DM father and mother died of heart dz > 32 yo.  Social History: Reviewed history from 06/09/2009 and no changes required. 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 Denies ETOH. No regular exercise.   Review of Systems      See HPI  Physical Exam  General:  alert, well-developed, well-nourished, and well-hydrated.   Head:  normocephalic and atraumatic.   Eyes:  sclera non icteric Mouth:  pharynx pink and moist.   Neck:  no masses.   Lungs:  normal respiratory effort, no intercostal retractions, and no accessory muscle use.  Heart:  normal rate, regular rhythm, and no murmur.   Msk:  tenderness at the R tibiolar joint with swelling.  No redness.  Pain with dorsifexion and wt bearin with some R great toe hallux riditus. Pulses:  2+ pedal pulses Extremities:  3+ pitting R foot and lower leg pitting edema, 1+ on the R dystrophic toenials Neurologic:  antalgic gait Skin:  color normal.   Psych:  good eye contact, not anxious appearing, and  not depressed appearing.     Impression & Recommendations:  Problem # 1:  BENIGN PROSTATIC HYPERTROPHY, WITH URINARY OBSTRUCTION (ICD-600.01) Wearing Foley Catheter for relief.  Will ask [facility] to change this out today and come out every 5-7 days until he is seen by Urology.  This appt has already been made.  I am extending his Cipro only because he is worried about symptoms of infection after having this placed.  Will add Avodart to Cardura and will take BOTH for BPH.  Will be due for a PSA in 8 wks. Orders: Home Health Referral (Home Health)  Problem # 2:  CHRONIC KIDNEY DISEASE STAGE III (MODERATE) (ICD-585.3) Recheck renal function in 8 wks.  Cr 1.3 c/w mild CKD.  May be from obstructive uropathy or chronic diuretic use (pre-renal azotemia).  Problem # 3:  GOUT, UNSPECIFIED (ICD-274.9) Acute flare.  Treat with  Indomethacin 50 mg three times a day with food x 10 days then back down to two times a day dosing for the first 6 mos of ULORIC.  REcheck Uric Acid in 8 wks. His updated medication list for this problem includes:    Uloric 40 Mg Tabs (Febuxostat) .Marland Kitchen... 1 tab by mouth daily    Indomethacin 50 Mg Caps (Indomethacin) .Marland Kitchen... 1 capsule by mouth three times a day with food for gouty flare x 10 days then take bid  Complete Medication List: 1)  Armour Thyroid 60 Mg Tabs (Thyroid) .... Take 1 tab by mouth once daily 2)  Lasix 40 Mg Tabs (Furosemide) .Marland Kitchen.. 1 tab by mouth once daily as needed for leg swelling 3)  Losartan Potassium 100 Mg Tabs (Losartan potassium) .Marland Kitchen.. 1 tab by mouth daily 4)  Potassium Chloride Cr 10 Meq Cr-tabs (Potassium chloride) .... Take as directed as needed w/ lasix 5)  Cipro 500 Mg Tabs (Ciprofloxacin hcl) .Marland Kitchen.. 1 tab by mouth q 12 hrs x 5 days 6)  Uloric 40 Mg Tabs (Febuxostat) .Marland Kitchen.. 1 tab by mouth daily 7)  Indomethacin 50 Mg Caps (Indomethacin) .Marland Kitchen.. 1 capsule by mouth three times a day with food for gouty flare x 10 days then take bid 8)  Avodart 0.5 Mg Caps (Dutasteride) .Marland Kitchen.. 1 capsule by mouth daily 9)  Cardura 1 Mg Tabs (Doxazosin mesylate) .Marland Kitchen.. 1 tab by mouth at bedtime  Patient Instructions: 1)  Will set up [facility] referal for change of Foley Catheter today. 2)  Start on Cipro 2 x a day for 7 days. 3)  Add Avodart once a day to Cardura for prostate enlargment. 4)  For acute gout:  Take Indomethacin 3 x a day for 10 days with food then change to 2 x a day (for the first 6 months of Uloric Use). 5)  Take Uloric 40 mg/ day for gout managment.  Goal will be to get your uric Acid level down to < 6. 6)  Recheck PSA and Uric Acid level in 8 wks.   Prescriptions: CARDURA 1 MG TABS (DOXAZOSIN MESYLATE) 1 tab by mouth at bedtime  #30 x 2   Entered and Authorized by:   [name]   Signed by:   [name] on 12/05/2009   Method used:   Printed then faxed to ...        [facility]* (retail)       [address]       [place], [place]  [address]       Ph: [phone]  Fax: [phone]   RxID:   [id number] AVODART 0.5 MG CAPS (DUTASTERIDE) 1 capsule by mouth daily  #30 x 1   Entered and Authorized by:   [name]   Signed by:   [name] on 12/05/2009   Method used:   Printed then faxed to ...       [facility]* (retail)       [address]       [place], [place]  [address]       Ph: [phone]       Fax: [phone]   RxID:   [id number] INDOMETHACIN 50 MG CAPS (INDOMETHACIN) 1 capsule by mouth three times a day with food for gouty flare x 10 days then take BID  #90 x 1   Entered and Authorized by:   [name]   Signed by:   [name] on 12/05/2009   Method used:   Printed then faxed to ...       [facility]* (retail)       [address]       [place], [place]  [address]       Ph: [phone]       Fax: [phone]   RxID:   [id number] ULORIC 40 MG TABS (FEBUXOSTAT) 1 tab by mouth daily  #30 x 2   Entered and Authorized by:   [name]   Signed by:   [name] on 12/05/2009   Method used:   Printed then faxed to ...       [facility]* (retail)       [address]       [place], [place]  [address]       Ph: [phone]       Fax: [phone]   RxID:   [id number] CIPRO 500 MG TABS (CIPROFLOXACIN HCL) 1 tab by mouth q 12 hrs x 5 days  #10 x 0   Entered and Authorized by:   [name]   Signed by:   [name] on 12/05/2009   Method used:   Printed then faxed to ...       [facility]* (retail)       [address]       [place], [place]  [address]       Ph: [phone]       Fax: [phone]   RxID:   [id number]

## 2010-05-26 NOTE — Progress Notes (Signed)
Summary: Lab order   

## 2010-05-26 NOTE — Progress Notes (Signed)
Summary: Trouble urinating  Phone Note Call from Patient   Caller: Patient Summary of Call: Pt states he has been taking flomax every night and 2 lasix but he is still have trouble urinating. Pt is scheduled at [facility] 12/22/09 and sooner apt is not available. Pt would like to know if there is anything else he should do. Please advise. Initial call taken by: [name],  November 27, 2009 2:42 PM  Follow-up for Phone Call        Pls make sure he is not taking more than the listed amt of Lasix on his list.  Pls call urology office to see if they can get him in sooner, o/w will need to go to [facility] for cath. Follow-up by: [name],  November 28, 2009 8:15 AM  Additional Follow-up for Phone Call Additional follow up Details #1::        Pt aware of the above. Additional Follow-up by: [name],  November 28, 2009 8:43 AM

## 2010-07-10 LAB — URINALYSIS, ROUTINE W REFLEX MICROSCOPIC
Glucose, UA: NEGATIVE mg/dL
Hgb urine dipstick: NEGATIVE
Leukocytes, UA: NEGATIVE
Protein, ur: NEGATIVE mg/dL
pH: 5 (ref 5.0–8.0)

## 2010-07-10 LAB — URINE CULTURE
Colony Count: NO GROWTH
Culture  Setup Time: 201108090201

## 2010-07-10 LAB — POCT I-STAT, CHEM 8
BUN: 15 mg/dL (ref 6–23)
Calcium, Ion: 1.12 mmol/L (ref 1.12–1.32)
Creatinine, Ser: 1.6 mg/dL — ABNORMAL HIGH (ref 0.4–1.5)
Glucose, Bld: 106 mg/dL — ABNORMAL HIGH (ref 70–99)
Hemoglobin: 11.9 g/dL — ABNORMAL LOW (ref 13.0–17.0)
Sodium: 142 mEq/L (ref 135–145)
TCO2: 27 mmol/L (ref 0–100)

## 2010-07-10 LAB — URINE MICROSCOPIC-ADD ON: Urine-Other: NONE SEEN

## 2010-07-24 ENCOUNTER — Telehealth: Payer: Self-pay | Admitting: *Deleted

## 2010-07-24 ENCOUNTER — Other Ambulatory Visit: Payer: Self-pay | Admitting: *Deleted

## 2010-07-24 MED ORDER — LOSARTAN POTASSIUM-HCTZ 100-12.5 MG PO TABS: 1.0000 | ORAL_TABLET | Freq: Every day | ORAL | Status: DC

## 2010-07-24 NOTE — Telephone Encounter (Signed)
Rx printed

## 2010-08-07 ENCOUNTER — Ambulatory Visit: Payer: Self-pay | Admitting: Family Medicine

## 2010-08-21 ENCOUNTER — Encounter: Payer: Self-pay | Admitting: Family Medicine

## 2010-08-21 ENCOUNTER — Ambulatory Visit (INDEPENDENT_AMBULATORY_CARE_PROVIDER_SITE_OTHER): Payer: Medicare Other | Admitting: Family Medicine

## 2010-08-21 DIAGNOSIS — E039 Hypothyroidism, unspecified: Secondary | ICD-10-CM

## 2010-08-21 DIAGNOSIS — E538 Deficiency of other specified B group vitamins: Secondary | ICD-10-CM

## 2010-08-21 DIAGNOSIS — I10 Essential (primary) hypertension: Secondary | ICD-10-CM

## 2010-08-21 DIAGNOSIS — M109 Gout, unspecified: Secondary | ICD-10-CM

## 2010-08-21 DIAGNOSIS — N138 Other obstructive and reflux uropathy: Secondary | ICD-10-CM

## 2010-08-21 DIAGNOSIS — Z13220 Encounter for screening for lipoid disorders: Secondary | ICD-10-CM

## 2010-08-21 MED ORDER — LOSARTAN POTASSIUM-HCTZ 100-12.5 MG PO TABS: 1.0000 | ORAL_TABLET | Freq: Every day | ORAL | Status: DC

## 2010-08-21 NOTE — Patient Instructions (Signed)
Update fasting labs downstairs one morning. Will call you w/ results.  BP looks great.  Restart prostate meds if you have any obstructive symptoms. Restart gout meds if you have any recurrences.  Return for f/u in 6 mos

## 2010-08-21 NOTE — Progress Notes (Signed)
  Subjective:    Patient ID: Brent Morrison, male    DOB: 02-27-1933, 77 y.o.   MRN: [id number]  HPI  77 yo WM presents for f/u visit.  He is doing well with his gout.  His BP looks great on Losartan -HCT.  He is due for fasting labs.  He is due to have his B12 and TSH rechecked.  Has not had problems with his BPH lately, staying off cold/ allergy meds- using Flomax and Avodart but does not take them everyday.    BP 122/70  Pulse 88  Ht 5\' 6"  (1.676 m)  Wt 188 lb (85.276 kg)  BMI 30.34 kg/m2  SpO2 96%   Review of Systems  Denies chest pain, SOB, palpitations.  + mild leg edema.  Denies difficult urination.     Objective:   Physical Exam  Constitutional: He appears well-developed and well-nourished. No distress.  HENT:  Head: Normocephalic and atraumatic.  Eyes: Pupils are equal, round, and reactive to light. No scleral icterus.  Neck: Neck supple. No JVD present.  Cardiovascular: Normal rate, regular rhythm and normal heart sounds.   Pulmonary/Chest: Effort normal and breath sounds normal. No respiratory distress. He has no wheezes.  Musculoskeletal: He exhibits edema (1-2+ pitting LE edema bilat).  Lymphadenopathy:    He has no cervical adenopathy.  Skin: Skin is warm and dry.  Psychiatric: He has a normal mood and affect.          Assessment & Plan:

## 2010-08-22 NOTE — Assessment & Plan Note (Signed)
BP at goal on Losartan HCTZ.  Continue.  RF and update labs.

## 2010-08-22 NOTE — Assessment & Plan Note (Signed)
Due to recheck TSH, on levothyroxine.

## 2010-08-22 NOTE — Assessment & Plan Note (Signed)
Stable.  Pt has taken himself off prevetive agents and has not had any recent flare ups.

## 2010-11-20 ENCOUNTER — Ambulatory Visit (INDEPENDENT_AMBULATORY_CARE_PROVIDER_SITE_OTHER): Payer: Medicare Other | Admitting: Family Medicine

## 2010-11-20 ENCOUNTER — Encounter: Payer: Self-pay | Admitting: Family Medicine

## 2010-11-20 VITALS — BP 123/73 | HR 96 | Ht 69.0 in | Wt 188.0 lb

## 2010-11-20 DIAGNOSIS — R600 Localized edema: Secondary | ICD-10-CM

## 2010-11-20 DIAGNOSIS — R609 Edema, unspecified: Secondary | ICD-10-CM

## 2010-11-20 MED ORDER — ALLOPURINOL 100 MG PO TABS: 100.0000 mg | ORAL_TABLET | Freq: Two times a day (BID) | ORAL | Status: DC

## 2010-11-20 MED ORDER — LOSARTAN POTASSIUM-HCTZ 100-25 MG PO TABS: 1.0000 | ORAL_TABLET | Freq: Every day | ORAL | Status: DC

## 2010-11-20 NOTE — Patient Instructions (Signed)
Change Losartan / HCTZ to 100/ 25 mg once daily. Try OTC compression hose (you can get at [employer]). Elevate legs during the day.  Return for nurse BP check in 3 wks.

## 2010-11-20 NOTE — Progress Notes (Signed)
  Subjective:    Patient ID: QUENTION MCNEILL, male    DOB: 1933/04/22, 77 y.o.   MRN: [id number]  HPI 77 yo WM presents for worsening LE edema x 2 wks.  He is not using lasix.  Has hx of obstructive uropathy, on Avodart and flomax and just saw Dr Aldean Ast for f/u.  No changes were made.  Voiding normally.  Not on any anti cholinergic meds.  Taking all meds as directed.  Not wearing compression hose or elevating legs.  Denies PND or orthopnea.  BP 123/73  Pulse 96  Ht 5\' 9"  (1.753 m)  Wt 188 lb (85.276 kg)  BMI 27.76 kg/m2  SpO2 96%    Review of Systems  Constitutional: Negative for fatigue.  Respiratory: Negative for shortness of breath.   Cardiovascular: Positive for leg swelling. Negative for chest pain and palpitations.  Genitourinary: Negative for difficulty urinating.       Objective:   Physical Exam  Constitutional: He appears well-developed and well-nourished.  HENT:  Mouth/Throat: Oropharynx is clear and moist.  Eyes: No scleral icterus.  Neck: No JVD present. No thyromegaly present.  Cardiovascular: Normal rate, regular rhythm and normal heart sounds.   Pulmonary/Chest: Effort normal and breath sounds normal. No respiratory distress.  Musculoskeletal: He exhibits edema (2+ bilat LE pitting edeam 1/2 was up both legs).  Skin: Skin is warm and dry.       No skin breakdown          Assessment & Plan:   No problem-specific assessment & plan notes found for this encounter.

## 2010-11-21 ENCOUNTER — Telehealth: Payer: Self-pay | Admitting: Family Medicine

## 2010-11-21 DIAGNOSIS — R600 Localized edema: Secondary | ICD-10-CM | POA: Insufficient documentation

## 2010-11-21 LAB — COMPLETE METABOLIC PANEL WITH GFR
AST: 12 U/L (ref 0–37)
Albumin: 3.7 g/dL (ref 3.5–5.2)
Alkaline Phosphatase: 61 U/L (ref 39–117)
BUN: 14 mg/dL (ref 6–23)
Creat: 1.31 mg/dL (ref 0.50–1.35)
GFR, Est Non African American: 53 mL/min — ABNORMAL LOW (ref >60)
Glucose, Bld: 88 mg/dL (ref 70–99)
Potassium: 4 mEq/L (ref 3.5–5.3)
Total Bilirubin: 0.4 mg/dL (ref 0.3–1.2)

## 2010-11-21 LAB — LIPID PANEL
Cholesterol: 147 mg/dL (ref 0–200)
Total CHOL/HDL Ratio: 3.1 Ratio
Triglycerides: 50 mg/dL (ref <150)

## 2010-11-21 LAB — CBC
HCT: 38.9 % — ABNORMAL LOW (ref 39.0–52.0)
Hemoglobin: 12.7 g/dL — ABNORMAL LOW (ref 13.0–17.0)
MCH: 30.8 pg (ref 26.0–34.0)
MCHC: 32.6 g/dL (ref 30.0–36.0)
MCV: 94.2 fL (ref 78.0–100.0)
RDW: 14.3 % (ref 11.5–15.5)

## 2010-11-21 NOTE — Assessment & Plan Note (Signed)
Labs came back normal.  No symptoms of CHF.  Hx of obstructive uropathy but seems to be voiding w/o problem.  He has not been taking any of his Lasix.  Will add Lasix for the next 7 days, elevate legs and OK to add compression hose.  Avoid high salt foods.  Change Lostartan HCTZ from 100/12.5 to 100/25 to see if this helps his swelling more long term so that we don't need to keep Lasix on board.  F/u in 1 wk.

## 2010-11-21 NOTE — Telephone Encounter (Signed)
Pls let pt know that his labs came back normal.

## 2010-11-23 ENCOUNTER — Telehealth: Payer: Self-pay | Admitting: Family Medicine

## 2010-11-23 NOTE — Telephone Encounter (Signed)
Pt's family member called and stated she needed to get the lab results for this pt.  Wants a copy mailed to the pt home address. Plan:  Copy of recent labs mailed.  Results given based on Dr. Cathey Endow order(telephone call). Jarvis Newcomer, LPN [facility]

## 2010-11-23 NOTE — Telephone Encounter (Signed)
LMOM informing Pt  

## 2010-12-11 ENCOUNTER — Ambulatory Visit: Payer: BC Managed Care – PPO

## 2010-12-11 ENCOUNTER — Ambulatory Visit (INDEPENDENT_AMBULATORY_CARE_PROVIDER_SITE_OTHER): Payer: Medicare Other | Admitting: Family Medicine

## 2010-12-11 DIAGNOSIS — I10 Essential (primary) hypertension: Secondary | ICD-10-CM

## 2010-12-11 MED ORDER — LOSARTAN POTASSIUM-HCTZ 100-25 MG PO TABS: 1.0000 | ORAL_TABLET | Freq: Every day | ORAL | Status: DC

## 2010-12-11 NOTE — Progress Notes (Signed)
  Subjective:    Patient ID: Brent Morrison, male    DOB: [birth year], 77 y.o.   MRN: [id number]  HPI  Pt denies chest pain, SOB, dizziness, or heart palpitations.  Taking meds as directed w/o problems.  Denies medication side effects.  5 min spent with pt.     Review of Systems     Objective:   Physical Exam        Assessment & Plan:  HTN - Looks great today. Med refilled. F/U with Dr. Thurmond Butts.

## 2011-02-19 ENCOUNTER — Ambulatory Visit: Payer: BC Managed Care – PPO | Admitting: Family Medicine

## 2011-03-12 ENCOUNTER — Other Ambulatory Visit: Payer: Self-pay | Admitting: *Deleted

## 2011-03-12 MED ORDER — LOSARTAN POTASSIUM-HCTZ 100-25 MG PO TABS: 1.0000 | ORAL_TABLET | Freq: Every day | ORAL | Status: DC

## 2011-06-09 ENCOUNTER — Encounter: Payer: Self-pay | Admitting: *Deleted

## 2011-06-11 ENCOUNTER — Ambulatory Visit: Payer: Medicare Other | Admitting: Physician Assistant

## 2011-06-11 ENCOUNTER — Ambulatory Visit (INDEPENDENT_AMBULATORY_CARE_PROVIDER_SITE_OTHER): Payer: Medicare Other | Admitting: Physician Assistant

## 2011-06-11 ENCOUNTER — Encounter: Payer: Self-pay | Admitting: Physician Assistant

## 2011-06-11 VITALS — BP 108/60 | HR 94 | Ht 68.0 in | Wt 186.0 lb

## 2011-06-11 DIAGNOSIS — E039 Hypothyroidism, unspecified: Secondary | ICD-10-CM | POA: Diagnosis not present

## 2011-06-11 DIAGNOSIS — I10 Essential (primary) hypertension: Secondary | ICD-10-CM

## 2011-06-11 MED ORDER — LOSARTAN POTASSIUM-HCTZ 100-25 MG PO TABS: 1.0000 | ORAL_TABLET | Freq: Every day | ORAL | Status: DC

## 2011-06-11 MED ORDER — THYROID 60 MG PO TABS: 60.0000 mg | ORAL_TABLET | Freq: Every day | ORAL | Status: DC

## 2011-06-11 NOTE — Patient Instructions (Signed)
Refilled Armour thyroid and Hyzaar. Follow up in 6 months.

## 2011-06-11 NOTE — Progress Notes (Signed)
  Subjective:    Patient ID: Brent Morrison, male    DOB: 1932-11-19, 78 y.o.   MRN: [id number]  HPI Patient present to clinic today for medication refill. He was last seen 6 months ago for blood work that was unremarkable and refills. His blood pressure is very controlled today and he has no complaints. He denies chest pains, SOB, palpations, heat or cold intolerances, weight loss or weight gain, fatigue, or any other problems.    Review of Systems     Objective:   Physical Exam  Constitutional: He is oriented to person, place, and time. He appears well-developed and well-nourished.  HENT:  Head: Normocephalic and atraumatic.  Neck: Normal range of motion. Neck supple. No thyromegaly present.  Cardiovascular: Normal rate, regular rhythm, normal heart sounds and intact distal pulses.   Pulmonary/Chest: Effort normal and breath sounds normal.  Neurological: He is alert and oriented to person, place, and time.  Skin: Skin is warm and dry.  Psychiatric: He has a normal mood and affect. His behavior is normal.          Assessment & Plan:  Hypertension- Refilled Hyzaar for 6 months. Will check labs with follow up in 6 months.  Hypothyroidism- Refilled Armour for 6 months. Will check TSH at follow up in 6 months.

## 2011-07-09 ENCOUNTER — Encounter: Payer: Self-pay | Admitting: Physician Assistant

## 2011-07-09 ENCOUNTER — Ambulatory Visit (INDEPENDENT_AMBULATORY_CARE_PROVIDER_SITE_OTHER): Payer: Medicare Other | Admitting: Physician Assistant

## 2011-07-09 VITALS — BP 123/74 | HR 98 | Ht 68.0 in | Wt 186.0 lb

## 2011-07-09 DIAGNOSIS — M79609 Pain in unspecified limb: Secondary | ICD-10-CM

## 2011-07-09 DIAGNOSIS — R252 Cramp and spasm: Secondary | ICD-10-CM

## 2011-07-09 DIAGNOSIS — M109 Gout, unspecified: Secondary | ICD-10-CM

## 2011-07-09 DIAGNOSIS — M79671 Pain in right foot: Secondary | ICD-10-CM

## 2011-07-09 MED ORDER — FUROSEMIDE 40 MG PO TABS: 40.0000 mg | ORAL_TABLET | Freq: Every day | ORAL | Status: DC | PRN

## 2011-07-09 MED ORDER — INDOMETHACIN 50 MG PO CAPS: 50.0000 mg | ORAL_CAPSULE | Freq: Three times a day (TID) | ORAL | Status: DC

## 2011-07-09 NOTE — Patient Instructions (Addendum)
Get labs drawn today for uric acid and potassium. Will call on Monday with results. Gave Indomethcin 50mg  three times a day for gout for 10 days. If not any better can get refill for 10 more days. Lasix for leg swelling only as needed once a day.Then if not any better call office for another appointment.

## 2011-07-10 LAB — URIC ACID: Uric Acid, Serum: 8.8 mg/dL — ABNORMAL HIGH (ref 4.0–7.8)

## 2011-07-10 NOTE — Progress Notes (Signed)
  Subjective:    Patient ID: Brent Morrison, male    DOB: 1932/07/07, 78 y.o.   MRN: [id number]  HPI Patient present to the clinic with a gouty flare in both feet that started 5 days ago and intermittent muscle cramps. Patient has a history of gout in both feet and takes allopurinol daily. He has not had a gout attack in over 1 year. He did eat a fatty meal that was fried in a lot of grease on Saturday night and then the next morning his feet started hurting and he believes this might have started the attack. Both feet are red and swollen. The pain is 6/10 and hurts if anything touches them. He has not tried anything else to help with pain. IN the past he has been on indomethcin and it has cleared up his gout. He also has recently started having muscle cramps in his upper thighs. He has a history of low potassium and has been on potassium pills in the past. He reports that he does drink a lot of water but does not watch his diet. He denies any alcohol usage.    Review of Systems     Objective:   Physical Exam  Constitutional: He is oriented to person, place, and time. He appears well-developed and well-nourished.  HENT:  Head: Normocephalic and atraumatic.  Cardiovascular: Normal rate, regular rhythm and normal heart sounds.   Pulmonary/Chest: Effort normal and breath sounds normal.  Neurological: He is alert and oriented to person, place, and time.  Skin:       Right foot and leg 2+pitting edema. Redness tint to skin with warmth felt on bottom of foot. Tenderness to palpation on sole of foot and all over bottom of foot with some tenderness on the top of foot. Strength 5/5.  Left fott and leg 1+ pitting edema. Skin normal in color. No warmth felt. Tenderness to palpation on sole of foot and all over bottom of foot. Strength 5/5.  Psychiatric: He has a normal mood and affect. His behavior is normal.          Assessment & Plan:  Gout, flare- Will get uric acid level. Started Indometicin  for 10 days. If not resolved in 10 days then repeat times 1. If not resolved after that will need to see patient in office again. Encouraged to stay away from certain diet triggers for gout such as red meat, grease, or alcohol. Gave lasix to use once a day only when needed for swelling.   Muscle cramps-order potassium level. If low will treat with medication. Until then patient can consider diet sources of potassium to increase to see if helps with muscle cramps. Increase avocados, bananas, baked potatoe with skin, or leafy greens. Also encouraged patient to stay hydrated to prevent any muscle cramps from dehydration.

## 2011-07-12 ENCOUNTER — Other Ambulatory Visit: Payer: Self-pay | Admitting: Physician Assistant

## 2011-07-12 MED ORDER — POTASSIUM CHLORIDE CRYS ER 20 MEQ PO TBCR: 20.0000 meq | EXTENDED_RELEASE_TABLET | Freq: Four times a day (QID) | ORAL | Status: DC

## 2011-07-12 NOTE — Progress Notes (Signed)
Kdur has been called in to pharmacy for patient to pick up and take 4 times a day for 7 days and recheck potassium.

## 2011-07-13 ENCOUNTER — Other Ambulatory Visit: Payer: Self-pay | Admitting: *Deleted

## 2011-07-13 MED ORDER — POTASSIUM CHLORIDE CRYS ER 20 MEQ PO TBCR: 20.0000 meq | EXTENDED_RELEASE_TABLET | Freq: Four times a day (QID) | ORAL | Status: DC

## 2011-07-16 ENCOUNTER — Ambulatory Visit (INDEPENDENT_AMBULATORY_CARE_PROVIDER_SITE_OTHER): Payer: Medicare Other | Admitting: Physician Assistant

## 2011-07-16 ENCOUNTER — Encounter: Payer: Self-pay | Admitting: Physician Assistant

## 2011-07-16 VITALS — BP 129/73 | HR 100 | Ht 68.0 in | Wt 194.0 lb

## 2011-07-16 DIAGNOSIS — E876 Hypokalemia: Secondary | ICD-10-CM | POA: Diagnosis not present

## 2011-07-16 DIAGNOSIS — R609 Edema, unspecified: Secondary | ICD-10-CM

## 2011-07-16 DIAGNOSIS — R600 Localized edema: Secondary | ICD-10-CM

## 2011-07-16 LAB — COMPLETE METABOLIC PANEL WITH GFR
ALT: 21 U/L (ref 0–53)
AST: 27 U/L (ref 0–37)
Albumin: 3.4 g/dL — ABNORMAL LOW (ref 3.5–5.2)
BUN: 14 mg/dL (ref 6–23)
CO2: 29 mEq/L (ref 19–32)
Calcium: 8.6 mg/dL (ref 8.4–10.5)
Chloride: 107 mEq/L (ref 96–112)
GFR, Est African American: 62 mL/min
Potassium: 4.1 mEq/L (ref 3.5–5.3)

## 2011-07-18 NOTE — Patient Instructions (Signed)
Will get labs and call with results and treatment plan.

## 2011-07-18 NOTE — Progress Notes (Signed)
  Subjective:    Patient ID: Brent Morrison, male    DOB: [birth year], 78 y.o.   MRN: [id number]  HPI Patient presents to clinic for f/u on gouty flare and bilateral edema. Patient reports that he no longer has any pain in either foot from gout and has stopped indomethacin for gout. He stills has swelling in bilateral legs though. He states that lasix is not helping and he had been taking everyday 40mg  tab. He even took 2 lasix one day and did not give him any relief. He has also been taking the potassium and has not had any leg cramps since starting potassium. He denies any chest pains, SOB, or palpitations. He has had swelling in the past and was sent to a kidney doctor and they gave him a medicine to get rid of swelling.    Review of Systems     Objective:   Physical Exam  Constitutional: He is oriented to person, place, and time. He appears well-developed and well-nourished.  Cardiovascular: Regular rhythm, normal heart sounds and intact distal pulses.        Tachycardia at 100.   Pulmonary/Chest: Effort normal and breath sounds normal. He has no wheezes.  Neurological: He is alert and oriented to person, place, and time.  Skin:       Bilateral legs 2+ pitting edema. Pedal pulses intact. Skin had a slight red tint to both legs. No pain with palpation on legs or feet.   Psychiatric: He has a normal mood and affect. His behavior is normal.          Assessment & Plan:  Gouty flare-resolved. No further treatment. Continue on allopurinol for prevention.   Bilateral leg edema- Continue to take lasix 40mg  daily with potassium tablet. Will get labs and call with results and decide what meds to give. Patient does have Chronic Kidney Disease stage III that we have to consider.   Muscle cramps in thighs- Resolved. Will check potassium levels.

## 2011-07-19 ENCOUNTER — Encounter: Payer: Self-pay | Admitting: Physician Assistant

## 2011-07-19 ENCOUNTER — Ambulatory Visit (INDEPENDENT_AMBULATORY_CARE_PROVIDER_SITE_OTHER): Payer: Medicare Other | Admitting: Physician Assistant

## 2011-07-19 VITALS — BP 107/69 | HR 99 | Ht 68.0 in | Wt 183.0 lb

## 2011-07-19 DIAGNOSIS — R609 Edema, unspecified: Secondary | ICD-10-CM | POA: Diagnosis not present

## 2011-07-19 DIAGNOSIS — R339 Retention of urine, unspecified: Secondary | ICD-10-CM | POA: Diagnosis not present

## 2011-07-19 DIAGNOSIS — R600 Localized edema: Secondary | ICD-10-CM

## 2011-07-19 DIAGNOSIS — N401 Enlarged prostate with lower urinary tract symptoms: Secondary | ICD-10-CM | POA: Diagnosis not present

## 2011-07-19 MED ORDER — FUROSEMIDE 40 MG PO TABS: 80.0000 mg | ORAL_TABLET | Freq: Two times a day (BID) | ORAL | Status: DC

## 2011-07-19 NOTE — Patient Instructions (Addendum)
Increase lasix to 80mg  twice a day for 1 week. Call by Friday to see if improving. Recheck in 1 week.

## 2011-07-19 NOTE — Progress Notes (Signed)
  Subjective:    Patient ID: Brent Morrison, male    DOB: Apr 11, 1933, 78 y.o.   MRN: [id number]  HPI Patient comes in today because his legs are still swollen. He does states that he is watching his salt intake but not eliminating it. He continues to take potassium and reports no cramps today. He is taking 80mg  of lasix daily but he states it is not doing much for his leg swelling. He has not used compression stockings. His pain from gout has resolved. He went to urologist and they told him he was not having any obstruction problems.    Review of Systems     Objective:   Physical Exam  Skin:       Bilateral pitting edema 2+ in both legs half way up leg. No skin ulcers or degradation noted. They do have a red tint to the color. Pulses are intact but seemed diminished.           Assessment & Plan:  Bilateral Leg Edema-Sent prescription home with patient for lasix 80mg  twice a day for 1 week. After discussing this with Dr. Linford Arnold she did not feel comfortable with him on this dose. I called patient and instructed him to only take 80mg  once a day and really work on other ways to decrease fluid load. We talked extensively about fluid restriction to 50oz a day, Salt restriction to 1.5mg , and compression stockings. Patient will recheck in 1 week. GFR not changed since last year. Potassium 4.1. Continue taking potassium with lasix daily. Urologist has checked and reported no problems with obstruction. Will consider sending to nephrologist if not able to diuresis.

## 2011-07-22 ENCOUNTER — Encounter: Payer: Self-pay | Admitting: *Deleted

## 2011-07-26 ENCOUNTER — Encounter: Payer: Self-pay | Admitting: Physician Assistant

## 2011-07-26 ENCOUNTER — Ambulatory Visit (INDEPENDENT_AMBULATORY_CARE_PROVIDER_SITE_OTHER): Payer: Medicare Other | Admitting: Physician Assistant

## 2011-07-26 VITALS — BP 122/69 | HR 89 | Wt 184.0 lb

## 2011-07-26 DIAGNOSIS — R609 Edema, unspecified: Secondary | ICD-10-CM | POA: Diagnosis not present

## 2011-07-26 DIAGNOSIS — R600 Localized edema: Secondary | ICD-10-CM

## 2011-07-26 MED ORDER — TAMSULOSIN HCL 0.4 MG PO CAPS: 0.4000 mg | ORAL_CAPSULE | Freq: Every day | ORAL | Status: DC

## 2011-07-26 MED ORDER — ALLOPURINOL 100 MG PO TABS: 100.0000 mg | ORAL_TABLET | Freq: Two times a day (BID) | ORAL | Status: DC

## 2011-07-26 NOTE — Patient Instructions (Signed)
Refilled medications. Only use lasix as needed for swelling and take potassium any time you take lasix.

## 2011-07-26 NOTE — Progress Notes (Signed)
  Subjective:    Patient ID: Brent Morrison, male    DOB: Mar 28, 1933, 78 y.o.   MRN: [id number]  HPI Patient presents to the clinic today to follow up on bilateral leg edema.patient is doing much better today and reports that all the swelling has gone down. He has stopped his Lasix and is no longer taking his potassium tablets. He denies any pain bilaterally in legs. He does need refills today on Flomax and allopurinol.   Review of Systems     Objective:   Physical Exam  Constitutional: He appears well-developed and well-nourished.  Cardiovascular: Normal rate, regular rhythm and normal heart sounds.   Pulmonary/Chest: Effort normal and breath sounds normal. He has no wheezes. He has no rales.  Skin:       Bilateral legs presented with residual edema around his ankles but no pitting edema going up his leg. All skin was intact and no ulcers or deterioration on either leg.          Assessment & Plan:  Bilateral leg edema-patient has stopped his Lasix and potassium tabs. Patient was instructed to only use Lasix if swelling resumes. If patient does need to use Lasix he was also told to take potassium tabs along with the Lasix. If he needs the Lasix for more than 4 days he needs to call office setting he can be evaluated.  Refilled Flomax and allopurinol.

## 2011-08-09 ENCOUNTER — Ambulatory Visit (INDEPENDENT_AMBULATORY_CARE_PROVIDER_SITE_OTHER): Payer: Medicare Other | Admitting: Physician Assistant

## 2011-08-09 ENCOUNTER — Encounter: Payer: Self-pay | Admitting: Physician Assistant

## 2011-08-09 VITALS — BP 128/74 | HR 86 | Ht 68.0 in | Wt 185.0 lb

## 2011-08-09 DIAGNOSIS — N139 Obstructive and reflux uropathy, unspecified: Secondary | ICD-10-CM

## 2011-08-09 DIAGNOSIS — R7309 Other abnormal glucose: Secondary | ICD-10-CM

## 2011-08-09 DIAGNOSIS — R609 Edema, unspecified: Secondary | ICD-10-CM

## 2011-08-09 DIAGNOSIS — E039 Hypothyroidism, unspecified: Secondary | ICD-10-CM

## 2011-08-09 DIAGNOSIS — Z79899 Other long term (current) drug therapy: Secondary | ICD-10-CM

## 2011-08-09 LAB — POCT GLYCOSYLATED HEMOGLOBIN (HGB A1C): Hemoglobin A1C: 5.9

## 2011-08-09 MED ORDER — FUROSEMIDE 10 MG/ML IJ SOLN
40.0000 mg | Freq: Once | INTRAMUSCULAR | Status: AC
  Administered 2011-08-09: 40 mg via INTRAVENOUS

## 2011-08-09 MED ORDER — FEBUXOSTAT 40 MG PO TABS: 40.0000 mg | ORAL_TABLET | Freq: Every day | ORAL | Status: DC

## 2011-08-09 NOTE — Progress Notes (Signed)
  Subjective:    Patient ID: Brent Morrison, male    DOB: May 13, 1932, 78 y.o.   MRN: [id number]  HPI Patient presents to clinic today to follow up on HTN and hypothyroidism. BP great today. No problems with thyroid and continues to do well on medication.  He is still having problems with bilateral leg swelling. He had an episode of gout a couple of months ago and has not been the same since. He reports he is taking the alloupurinol daily. He has not taken any lasix because he states that "it doesn't help anymore". In the past he had some type of "urinary obstruction unsepcefied and urologist but him on flomax. Patient has not been taking flomax daily but only as needed. He continues to urinate about 4-5 times a day. He had a gout flare a couple of months ago but has not had any feet pain since then.   Patient has had no SOB, chest pain, or gouty pain.    Review of Systems     Objective:   Physical Exam  Constitutional: He is oriented to person, place, and time. He appears well-developed and well-nourished.  HENT:  Head: Normocephalic and atraumatic.  Cardiovascular: Normal rate, regular rhythm and normal heart sounds.        No JVD. Pedal pulses and Radial pulses 2+,   Pulmonary/Chest: Effort normal and breath sounds normal. He has no wheezes. He has no rales.  Neurological: He is alert and oriented to person, place, and time.  Skin: Skin is warm and dry.       1+ pitting edema bilateral legs with swelling up to mid calf. Skin intact, no ulcerations, no skin leakage.  Psychiatric: He has a normal mood and affect. His behavior is normal.          Assessment & Plan:  HTN- CMP ordered for follow up will call with results. Reassured Patient that Hyzaar is not why he is swelling he has been on Hyzaar for over a year and not had problems. BP is wonderful.  Hypothyroidism- TSH ordered will call and adjust medication as needed. Patient reports no problems.  Bilateral leg swelling- Lasix  40 IM was given in office. Patient believes that oral has stopped working because when he takes it does not make swelling go down.   Urinary difficulty- patient has been seen by urologist recently and they reported there was no obstruction. Patient reports that he does not have enlarged prostate but is on flomax and it has helped. He has not been taking daily therefore I told him to start taking daily and if not able to urinate better then we would send him to urology.   Elevated glucose- Patient requested glucose to be tested due to family hx. A1C was 5.9. Discussed with patient diet changes and things he could implement now to stop progression into diabetes. Discussed good and bad carbs and gave literature handouts about nutrition. Will check in 1 year.  Gout- D/C Alloupurinol due to renal disease and started Uloric. Discussed with patient to call if gout worsens and we will recheck uric acid levels and LFT's in 2 months.

## 2011-08-09 NOTE — Patient Instructions (Addendum)
Start taking every day flomax and if urinary symptoms not improving then we can refer to urologist. Let's switch to Uloric instead of allopurinol since has kidney disease. Follow up in 2 months to recheck. Start watching your diet and limit carbs and make better choices.

## 2011-08-13 DIAGNOSIS — Z5189 Encounter for other specified aftercare: Secondary | ICD-10-CM | POA: Diagnosis not present

## 2011-08-13 DIAGNOSIS — E039 Hypothyroidism, unspecified: Secondary | ICD-10-CM | POA: Diagnosis not present

## 2011-08-13 DIAGNOSIS — Z79899 Other long term (current) drug therapy: Secondary | ICD-10-CM | POA: Diagnosis not present

## 2011-08-13 DIAGNOSIS — N139 Obstructive and reflux uropathy, unspecified: Secondary | ICD-10-CM | POA: Diagnosis not present

## 2011-08-14 LAB — COMPREHENSIVE METABOLIC PANEL
Alkaline Phosphatase: 57 U/L (ref 39–117)
CO2: 25 mEq/L (ref 19–32)
Creat: 1.19 mg/dL (ref 0.50–1.35)
Glucose, Bld: 84 mg/dL (ref 70–99)
Total Bilirubin: 0.5 mg/dL (ref 0.3–1.2)

## 2011-08-14 LAB — TSH: TSH: 0.228 u[IU]/mL — ABNORMAL LOW (ref 0.350–4.500)

## 2011-08-14 LAB — CBC WITH DIFFERENTIAL/PLATELET
Basophils Relative: 0 % (ref 0–1)
Hemoglobin: 12.9 g/dL — ABNORMAL LOW (ref 13.0–17.0)
Lymphocytes Relative: 15 % (ref 12–46)
Lymphs Abs: 0.9 10*3/uL (ref 0.7–4.0)
Monocytes Relative: 11 % (ref 3–12)
Neutro Abs: 4.2 10*3/uL (ref 1.7–7.7)
Neutrophils Relative %: 74 % (ref 43–77)
RBC: 4.18 MIL/uL — ABNORMAL LOW (ref 4.22–5.81)
WBC: 5.7 10*3/uL (ref 4.0–10.5)

## 2011-08-14 LAB — PSA: PSA: 1.13 ng/mL (ref <=4.00)

## 2011-08-16 MED ORDER — THYROID 30 MG PO TABS: 30.0000 mg | ORAL_TABLET | Freq: Every day | ORAL | Status: DC

## 2011-08-16 NOTE — Progress Notes (Signed)
Addended by: Jomarie Longs on: 08/16/2011 06:01 PM   Modules accepted: Orders

## 2011-08-16 NOTE — Progress Notes (Signed)
Addended by: Jomarie Longs on: 08/16/2011 05:58 PM   Modules accepted: Orders

## 2011-09-09 ENCOUNTER — Other Ambulatory Visit: Payer: Self-pay | Admitting: *Deleted

## 2011-09-09 MED ORDER — LOSARTAN POTASSIUM-HCTZ 100-25 MG PO TABS: 1.0000 | ORAL_TABLET | Freq: Every day | ORAL | Status: DC

## 2011-11-04 ENCOUNTER — Telehealth: Payer: Self-pay | Admitting: *Deleted

## 2011-11-04 DIAGNOSIS — E039 Hypothyroidism, unspecified: Secondary | ICD-10-CM

## 2011-11-05 NOTE — Telephone Encounter (Signed)
Quick Note:  All labs are normal. ______ 

## 2011-11-07 ENCOUNTER — Other Ambulatory Visit: Payer: Self-pay | Admitting: Physician Assistant

## 2011-11-10 ENCOUNTER — Telehealth: Payer: Self-pay | Admitting: *Deleted

## 2011-11-10 MED ORDER — THYROID 15 MG PO TABS: 15.0000 mg | ORAL_TABLET | Freq: Every day | ORAL | Status: DC

## 2011-11-10 MED ORDER — THYROID 30 MG PO TABS: 30.0000 mg | ORAL_TABLET | Freq: Every day | ORAL | Status: DC

## 2011-11-10 NOTE — Telephone Encounter (Signed)
They called stating that when the thyroid med was sent to the pharmacy for 30mg  tabs that when pt breaks them in half they crumble. Pt states that [facility] informed him that they could order 15mg  tabs if he had an rx sent. He would like to have thyroid Armour 30mg  and thyroid Armour 15mg  sent to rx for a 2 month supply on each. States this would be better than breaking them in half. Please advise.

## 2011-11-10 NOTE — Telephone Encounter (Signed)
Pt informed and rx sent 

## 2011-11-10 NOTE — Telephone Encounter (Signed)
OK for you to send as patient requested.

## 2011-12-15 ENCOUNTER — Other Ambulatory Visit: Payer: Self-pay | Admitting: *Deleted

## 2011-12-15 MED ORDER — LOSARTAN POTASSIUM-HCTZ 100-25 MG PO TABS: 1.0000 | ORAL_TABLET | Freq: Every day | ORAL | Status: DC

## 2011-12-17 ENCOUNTER — Other Ambulatory Visit: Payer: Self-pay | Admitting: *Deleted

## 2011-12-17 MED ORDER — ALLOPURINOL 100 MG PO TABS: 100.0000 mg | ORAL_TABLET | Freq: Two times a day (BID) | ORAL | Status: DC

## 2012-01-20 DIAGNOSIS — L570 Actinic keratosis: Secondary | ICD-10-CM | POA: Diagnosis not present

## 2012-01-20 DIAGNOSIS — D2330 Other benign neoplasm of skin of unspecified part of face: Secondary | ICD-10-CM | POA: Diagnosis not present

## 2012-02-11 ENCOUNTER — Ambulatory Visit (INDEPENDENT_AMBULATORY_CARE_PROVIDER_SITE_OTHER): Payer: Medicare Other | Admitting: Family Medicine

## 2012-02-11 ENCOUNTER — Encounter: Payer: Self-pay | Admitting: Family Medicine

## 2012-02-11 VITALS — BP 132/73 | HR 80 | Ht 68.0 in | Wt 175.0 lb

## 2012-02-11 DIAGNOSIS — N183 Chronic kidney disease, stage 3 unspecified: Secondary | ICD-10-CM | POA: Diagnosis not present

## 2012-02-11 DIAGNOSIS — Z8639 Personal history of other endocrine, nutritional and metabolic disease: Secondary | ICD-10-CM | POA: Insufficient documentation

## 2012-02-11 DIAGNOSIS — M109 Gout, unspecified: Secondary | ICD-10-CM

## 2012-02-11 DIAGNOSIS — E039 Hypothyroidism, unspecified: Secondary | ICD-10-CM | POA: Diagnosis not present

## 2012-02-11 DIAGNOSIS — Z862 Personal history of diseases of the blood and blood-forming organs and certain disorders involving the immune mechanism: Secondary | ICD-10-CM | POA: Diagnosis not present

## 2012-02-11 DIAGNOSIS — K4090 Unilateral inguinal hernia, without obstruction or gangrene, not specified as recurrent: Secondary | ICD-10-CM

## 2012-02-11 DIAGNOSIS — R0982 Postnasal drip: Secondary | ICD-10-CM

## 2012-02-11 MED ORDER — FLUTICASONE PROPIONATE 50 MCG/ACT NA SUSP: NASAL | Status: DC

## 2012-02-11 NOTE — Progress Notes (Signed)
CC: Brent Morrison is a 78 y.o. male is here for Hernia and request lab work   Subjective: HPI:  Patient complains of left groin swelling for the past 2 months which occurred suddenly after he was lifting heavy logs. A sizable bulge in his left groin has been growing on a weekly basis. He tells that he's able to push this bulge laterally but often slowly comes back to the midline. He tells me it is never approached his testicles nor has he had any testicular or abdominal pain. He has a slight pressure-like discomfort at the site of this bulge it is relieved with moving it laterally. He denies abdominal pain, blood in stools, or light stools, diarrhea, bowel irregularities, nor genitourinary complaints.    he is requesting that we check his thyroid function. His last TSH was in July and was normal however he's concerned because he was was originally taking half a tablet of Armour Thyroid in addition to a whole tablet and use concerned that half tablet was not consistently truly have a total dose. He denies constipation, fatigue, irregular heartbeat, rapid heartbeat, weight gain or loss, skin nail or hair changes.   carries a history of hypokalemia and is currently taking potassium supplements. He is worried that his diet may be influencing his potassium level and wonders if he still needs the potassium supplements. He's been trying to eat more vegetables and fruits that contain potassium in hopes of not needing supplementation. He denies muscle aches, soreness, nor weakness.   carries a diagnosis of gout, currently taking allopurinol and uloric. He thinks his last gout flare was a little over 2 months ago but is unsure as to what joint was involved. He denies joint discomfort, swelling, nor redness over the past 2-3 months. He tries to avoid high-protein meals and does not drink alcohol in excess.   He carries a diagnosis of essential hypertension and denies exertional chest pain, shortness of breath,  orthopnea, motor sensory disturbances, nor peripheral edema he has no outside blood pressures to report   he also complains of a cough and a "tickle "in the back of his throat that in present for 2 weeks and seem to be worse only when lying down flat. The cough is nonproductive. She's had some mild nasal congestion and is frustrated that he can't breathe through his nose as well as he has in the past. He states he has usual symptoms like this every fall. Is not taking an over-the-counter for this at the time. He denies swollen lymph nodes, trouble swallowing, nor facial pain no dizziness   Review Of Systems Outlined In HPI  Past Medical History  Diagnosis Date  . Hypothyroidism   . Hypertension   . Gout   . BPH (benign prostatic hyperplasia)      Family History  Problem Relation Age of Onset  . Heart disease Mother     >46  . Heart disease Father     >65  . Diabetes Sister      History  Substance Use Topics  . Smoking status: Former Smoker    Quit date: 04/26/1962  . Smokeless tobacco: Not on file  . Alcohol Use: No     Objective: Filed Vitals:   02/11/12 1056  BP: 132/73  Pulse: 80    General: Alert and Oriented, No Acute Distress HEENT: Pupils equal, round, reactive to light. Conjunctivae clear.  External ears unremarkable, canals clear with intact TMs with appropriate landmarks.  Middle ear appears  open without effusion. Pink inferior turbinates.  Moist mucous membranes, pharynx without inflammation  Moderate cobblestoning is present .  Neck supple without palpable lymphadenopathy nor abnormal masses. Lungs: Clear to auscultation bilaterally, no wheezing/ronchi/rales.  Comfortable work of breathing. Good air movement. Cardiac: Regular rate and rhythm. Normal S1/S2.  No murmurs, rubs, nor gallops.   Abdomen: Normal bowel sounds, soft and non tender without palpable masses. Genitourinary: He has a soft and mobile mass approximately the size of half of a banana that seems  to be coming through the inguinal canal and reaches but does not go beyond the proximal left testicle. This mass is easily reduced up the inguinal canal easily but immediately returns to its original position. Left testicle is nontender. Extremities: No peripheral edema.  Strong peripheral pulses.  Mental Status: No depression, anxiety, nor agitation. Skin: Warm and dry.  Assessment & Plan: Brent Morrison was seen today for hernia and request lab work.  Diagnoses and associated orders for this visit:  Chronic kidney disease stage iii (moderate) - COMPLETE METABOLIC PANEL WITH GFR  Unspecified hypothyroidism - TSH  Gout, unspecified - Uric acid  History of hypokalemia - COMPLETE METABOLIC PANEL WITH GFR  Left inguinal hernia - Ambulatory referral to General Surgery  Postnasal drip - fluticasone (FLONASE) 50 MCG/ACT nasal spray; Two sprays each nostril daily for postnasal drip.    Getting complete metabolic panel which will aid in a current GFR to see if we need to alter his allopurinol dose, assess for adequate potassium, and per the patient's request checking protein given an abnormal albumin of his last panel, he's wondering if he needs increased his protein intake. TSH obtained per the patient's request do to history of irregular Armour Thyroid use around the time of his last TSH in July. Urgent general surgery referral for left inguinal hernia,Signs and symptoms requring emergent/urgent reevaluation were discussed with the patient. Postnasal drip likely causing his cough therefore start Flonase. Uric acid level with a goal of less than 6. Hypertension appears well controlled in this elderly gentleman no changes to meds  Return in about 3 months (around 05/13/2012).

## 2012-02-12 LAB — COMPLETE METABOLIC PANEL WITH GFR
AST: 15 U/L (ref 0–37)
Alkaline Phosphatase: 65 U/L (ref 39–117)
BUN: 20 mg/dL (ref 6–23)
Calcium: 8.9 mg/dL (ref 8.4–10.5)
Chloride: 102 mEq/L (ref 96–112)
Creat: 1.27 mg/dL (ref 0.50–1.35)
GFR, Est Non African American: 54 mL/min — ABNORMAL LOW

## 2012-02-12 LAB — URIC ACID: Uric Acid, Serum: 8.4 mg/dL — ABNORMAL HIGH (ref 4.0–7.8)

## 2012-02-14 ENCOUNTER — Encounter: Payer: Self-pay | Admitting: Family Medicine

## 2012-02-14 ENCOUNTER — Telehealth: Payer: Self-pay | Admitting: Family Medicine

## 2012-02-14 MED ORDER — THYROID 15 MG PO TABS: 15.0000 mg | ORAL_TABLET | Freq: Every day | ORAL | Status: DC

## 2012-02-14 MED ORDER — ALLOPURINOL 100 MG PO TABS: 200.0000 mg | ORAL_TABLET | Freq: Two times a day (BID) | ORAL | Status: DC

## 2012-02-14 MED ORDER — THYROID 30 MG PO TABS: 30.0000 mg | ORAL_TABLET | Freq: Every day | ORAL | Status: DC

## 2012-02-14 NOTE — Telephone Encounter (Signed)
Called patient regarding continued elevated uric acid, will increase allopurinol to 200mg  bid.  No other changes to meds.

## 2012-02-28 ENCOUNTER — Encounter (INDEPENDENT_AMBULATORY_CARE_PROVIDER_SITE_OTHER): Payer: Self-pay | Admitting: Surgery

## 2012-02-28 ENCOUNTER — Ambulatory Visit (INDEPENDENT_AMBULATORY_CARE_PROVIDER_SITE_OTHER): Payer: Medicare Other | Admitting: Surgery

## 2012-02-28 VITALS — BP 132/64 | HR 100 | Temp 98.4°F | Resp 20 | Ht 70.5 in | Wt 178.6 lb

## 2012-02-28 DIAGNOSIS — K4090 Unilateral inguinal hernia, without obstruction or gangrene, not specified as recurrent: Secondary | ICD-10-CM | POA: Diagnosis not present

## 2012-02-28 NOTE — Progress Notes (Signed)
Patient ID: Brent Patriarca., male   DOB: Oct 16, 1932, 78 y.o.   MRN: [id number]  Chief Complaint  Patient presents with  . Pre-op Exam    eval [facility]    HPI Brent Morrison. is a 77 y.o. male.   HPIthis is a very pleasant gentleman referred by Dr. Ivan Anchors for evaluation of a symptomatic left inguinal hernia. The patient was lifting a heavy log when he had the sudden onset of groin pain and then noticed a reducible bulge. He has mild discomfort in the groin now. He has had some constipation but no other obstructive symptoms. He reports that the hernia easily reduces. He has no other complaints. The pain is a mild ache but does not refer any where else  Past Medical History  Diagnosis Date  . Hypothyroidism   . Hypertension   . Gout   . BPH (benign prostatic hyperplasia)     Past Surgical History  Procedure Date  . Ruptured viscous bilat 1994, 2001    Family History  Problem Relation Age of Onset  . Heart disease Mother     >36  . Heart disease Father     >65  . Diabetes Sister     Social History History  Substance Use Topics  . Smoking status: Former Smoker    Quit date: 04/26/1962  . Smokeless tobacco: Not on file  . Alcohol Use: No    Allergies  Allergen Reactions  . Ivp Dye (Iodinated Diagnostic Agents) Swelling    Only of the tongue  . Other Swelling    *veg dye* swelling only of the tongue    Current Outpatient Prescriptions  Medication Sig Dispense Refill  . allopurinol (ZYLOPRIM) 100 MG tablet Take 2 tablets (200 mg total) by mouth 2 (two) times daily.  180 tablet  1  . fluticasone (FLONASE) 50 MCG/ACT nasal spray Two sprays each nostril daily for postnasal drip.  16 g  2  . furosemide (LASIX) 40 MG tablet Take 2 tablets (80 mg total) by mouth 2 (two) times daily. For leg swelling for 1 week and then we will recheck.  60 tablet  0  . losartan-hydrochlorothiazide (HYZAAR) 100-25 MG per tablet Take 1 tablet by mouth daily.  90 tablet  0  . potassium  chloride SA (K-DUR,KLOR-CON) 20 MEQ tablet Take 1 tablet (20 mEq total) by mouth 4 (four) times daily. For 7 days and recheck.  60 tablet  0  . Tamsulosin HCl (FLOMAX) 0.4 MG CAPS Take 1 capsule (0.4 mg total) by mouth at bedtime.  30 capsule  3  . thyroid (ARMOUR) 15 MG tablet Take 1 tablet (15 mg total) by mouth daily.  90 tablet  1  . thyroid (ARMOUR) 30 MG tablet Take 1 tablet (30 mg total) by mouth daily.  90 tablet  1    Review of Systems Review of Systems  HENT: Positive for congestion.   Eyes: Positive for visual disturbance.  Respiratory: Negative.   Cardiovascular: Negative.   Gastrointestinal: Positive for constipation. Negative for nausea, vomiting, abdominal pain, diarrhea, blood in stool, abdominal distention and anal bleeding.  Genitourinary: Positive for difficulty urinating.  Musculoskeletal: Positive for arthralgias.  Neurological: Negative.   Hematological: Negative.   Psychiatric/Behavioral: Negative.     Blood pressure 132/64, pulse 100, temperature 98.4 F (36.9 C), temperature source Temporal, resp. rate 20, height 5' 10.5" (1.791 m), weight 178 lb 9.6 oz (81.012 kg).  Physical Exam Physical Exam  Constitutional: He is oriented to  person, place, and time. He appears well-developed and well-nourished. No distress.  HENT:  Head: Normocephalic and atraumatic.  Right Ear: External ear normal.  Left Ear: External ear normal.  Nose: Nose normal.  Mouth/Throat: Oropharynx is clear and moist.  Eyes: Conjunctivae normal are normal. Pupils are equal, round, and reactive to light. Right eye exhibits no discharge. Left eye exhibits no discharge. No scleral icterus.  Neck: Normal range of motion. Neck supple. No tracheal deviation present. No thyromegaly present.  Cardiovascular: Normal rate, regular rhythm, normal heart sounds and intact distal pulses.   No murmur heard. Pulmonary/Chest: Effort normal and breath sounds normal. No respiratory distress. He has no wheezes.  He has no rales.  Abdominal: Soft. Bowel sounds are normal. He exhibits no distension. There is no tenderness. There is no rebound.       There is an easily reducible moderate-sized left inguinal hernia. There is no right inguinal hernia. there is a tiny fascial defect at the umbilicus  Musculoskeletal: Normal range of motion. He exhibits no edema and no tenderness.  Lymphadenopathy:    He has no cervical adenopathy.  Neurological: He is alert and oriented to person, place, and time.  Skin: Skin is warm and dry. No rash noted. He is not diaphoretic. No erythema.  Psychiatric: His behavior is normal. Judgment normal.    Data Reviewed   Assessment    Left inguinal hernia    Plan    As he is symptomatic and the hernia is moderate in size, repair with mesh was recommended. I would recommend open repair. I discussed the risk of surgery which includes but is not limited to bleeding, infection, injury to surrounding structures, chronic pain, nerve entrapment, recurrence, etc. I also discussed postoperative recovery. He understands and wishes to proceed. Likelihood of success is good       Roby Spalla A 02/28/2012, 3:43 PM

## 2012-03-08 DIAGNOSIS — H548 Legal blindness, as defined in USA: Secondary | ICD-10-CM | POA: Diagnosis not present

## 2012-03-08 DIAGNOSIS — Z961 Presence of intraocular lens: Secondary | ICD-10-CM | POA: Diagnosis not present

## 2012-03-08 DIAGNOSIS — H33029 Retinal detachment with multiple breaks, unspecified eye: Secondary | ICD-10-CM | POA: Diagnosis not present

## 2012-03-08 DIAGNOSIS — H348192 Central retinal vein occlusion, unspecified eye, stable: Secondary | ICD-10-CM | POA: Diagnosis not present

## 2012-03-13 ENCOUNTER — Telehealth (INDEPENDENT_AMBULATORY_CARE_PROVIDER_SITE_OTHER): Payer: Self-pay | Admitting: General Surgery

## 2012-03-13 NOTE — Telephone Encounter (Signed)
Pt's friend called to ask advice for this elderly gentleman she helps out.  He is scheduled for IHR on 03/29/12, but is constipated and having trouble keeping the hernia reduced while pushing the BM.  Recommended he add Miralax and Colace to his regimen.  He may be leaking some stool, possibly around a larger piece of feces.  Suggested he try a Fleets enema to add some liquid to soften the stool and help it pass.  Friend will pass on the information to pt's sister to purchase these for him.  Strongly advised correcting any constipation and establishing a regular BM daily before the hernia surgery to make the recovery easier for the pt.

## 2012-03-16 ENCOUNTER — Encounter (HOSPITAL_COMMUNITY): Payer: Self-pay | Admitting: Pharmacy Technician

## 2012-03-20 ENCOUNTER — Encounter (HOSPITAL_COMMUNITY): Payer: Self-pay

## 2012-03-20 ENCOUNTER — Encounter (HOSPITAL_COMMUNITY)
Admission: RE | Admit: 2012-03-20 | Discharge: 2012-03-20 | Disposition: A | Discharge status: Home / Self Care | Payer: Medicare Other | Source: Ambulatory Visit | Attending: Surgery | Admitting: Surgery

## 2012-03-20 ENCOUNTER — Ambulatory Visit (HOSPITAL_COMMUNITY)
Admission: RE | Admit: 2012-03-20 | Discharge: 2012-03-20 | Disposition: A | Discharge status: Home / Self Care | Payer: Medicare Other | Source: Ambulatory Visit | Attending: Surgery | Admitting: Surgery

## 2012-03-20 DIAGNOSIS — N400 Enlarged prostate without lower urinary tract symptoms: Secondary | ICD-10-CM

## 2012-03-20 DIAGNOSIS — R059 Cough, unspecified: Secondary | ICD-10-CM | POA: Insufficient documentation

## 2012-03-20 DIAGNOSIS — Z01818 Encounter for other preprocedural examination: Secondary | ICD-10-CM | POA: Insufficient documentation

## 2012-03-20 DIAGNOSIS — R0602 Shortness of breath: Secondary | ICD-10-CM | POA: Diagnosis not present

## 2012-03-20 DIAGNOSIS — K4090 Unilateral inguinal hernia, without obstruction or gangrene, not specified as recurrent: Secondary | ICD-10-CM | POA: Insufficient documentation

## 2012-03-20 DIAGNOSIS — R05 Cough: Secondary | ICD-10-CM | POA: Insufficient documentation

## 2012-03-20 DIAGNOSIS — H547 Unspecified visual loss: Secondary | ICD-10-CM

## 2012-03-20 HISTORY — DX: Allergy, unspecified, initial encounter: T78.40XA

## 2012-03-20 HISTORY — DX: Benign prostatic hyperplasia without lower urinary tract symptoms: N40.0

## 2012-03-20 HISTORY — DX: Unspecified visual loss: H54.7

## 2012-03-20 LAB — BASIC METABOLIC PANEL
Calcium: 9.3 mg/dL (ref 8.4–10.5)
Creatinine, Ser: 1.27 mg/dL (ref 0.50–1.35)
GFR calc non Af Amer: 53 mL/min — ABNORMAL LOW (ref >90)
Glucose, Bld: 91 mg/dL (ref 70–99)
Sodium: 138 mEq/L (ref 135–145)

## 2012-03-20 NOTE — Progress Notes (Signed)
03-20-12 Note CXR viewable in Epic-please note results. W. Kennon Portela

## 2012-03-20 NOTE — Patient Instructions (Addendum)
20 Brent Morrison.  03/20/2012   Your procedure is scheduled on:  12-4 -2013  Report to [facility] at     1000   AM .  Call this number if you have problems the morning of surgery: [phone]  Or Presurgical Testing [phone]([name])   Remember:   Do not eat food:After Midnight.  May have clear liquids:up to 6 Hours before arrival. Nothing after : 0600 AM  Clear liquids include soda, tea, black coffee, apple or grape juice, broth.  Take these medicines the morning of surgery with A SIP OF WATER: Thyroid med. Allopurinol. Do not take Blood pressure med((Losartan) AM of surgery.   Do not wear jewelry, make-up or nail polish.  Do not wear lotions, powders, or perfumes. You may wear deodorant.  Do not shave 48 hours prior to surgery.(face and neck okay, no shaving of legs)  Do not bring valuables to the hospital.  Contacts, dentures or bridgework may not be worn into surgery.  Leave suitcase in the car. After surgery it may be brought to your room.  For patients admitted to the hospital, checkout time is 11:00 AM the day of discharge.   Patients discharged the day of surgery will not be allowed to drive home. Must have responsible person with you x 24 hours once discharged.  Name and phone number of your driver: Dola Argyle, girlfriend [phone]  Special Instructions: CHG Shower Use Special Wash: see special instruction sheet.(avoid face and genitals)   Please read over the following fact sheets that you were given: MRSA Information.

## 2012-03-20 NOTE — Pre-Procedure Instructions (Addendum)
03-20-12 EKG/ CXR today. 03-20-12 Note to in basket to make aware CXR results abnormal-viewable in Epic. W. Kennon Portela

## 2012-03-28 ENCOUNTER — Telehealth (INDEPENDENT_AMBULATORY_CARE_PROVIDER_SITE_OTHER): Payer: Self-pay | Admitting: General Surgery

## 2012-03-28 ENCOUNTER — Encounter (HOSPITAL_COMMUNITY): Payer: Self-pay

## 2012-03-28 ENCOUNTER — Encounter (HOSPITAL_COMMUNITY): Payer: Self-pay | Admitting: Pharmacy Technician

## 2012-03-28 MED ORDER — CEFAZOLIN SODIUM-DEXTROSE 2-3 GM-% IV SOLR
2.0000 g | INTRAVENOUS | Status: AC
  Administered 2012-03-29: 2 g via INTRAVENOUS
  Filled 2012-03-28: qty 50

## 2012-03-28 NOTE — Telephone Encounter (Signed)
Pt called in explaining that he was confused after speaking with pre-admission.  He was under the impression that they were going to numb his kidneys and bladder and he was extremely worried about this.  He explained that he wanted to be catheterized before his surgery and he wanted to make sure he was going to be under general anesthesia.  I explained that I would call Joyce Gross at pre-adm and find out for sure for him.  I called Joyce Gross and she explained he is scheduled for general anesthesia.  I informed the patient of this and explained to him that when he goes for his surgery tomorrow, to let the anesthesiologist know that he wants to be catheterized and that they should put in those orders for him and have it done.  He said that would be fine and he was pleased with this information.

## 2012-03-28 NOTE — H&P (Signed)
Patient ID: Brent Morrison., male DOB: 1932/05/07, 78 y.o. MRN: [id number]  Chief Complaint   Patient presents with   .  Pre-op Exam     eval [facility]    HPI  Brent Morrison. is a 78 y.o. male.  HPIthis is a very pleasant gentleman referred by Dr. Ivan Anchors for evaluation of a symptomatic left inguinal hernia. The patient was lifting a heavy log when he had the sudden onset of groin pain and then noticed a reducible bulge. He has mild discomfort in the groin now. He has had some constipation but no other obstructive symptoms. He reports that the hernia easily reduces. He has no other complaints. The pain is a mild ache but does not refer any where else  Past Medical History   Diagnosis  Date   .  Hypothyroidism    .  Hypertension    .  Gout    .  BPH (benign prostatic hyperplasia)     Past Surgical History   Procedure  Date   .  Ruptured viscous bilat  1994, 2001    Family History   Problem  Relation  Age of Onset   .  Heart disease  Mother       >32    .  Heart disease  Father       >65    .  Diabetes  Sister     Social History  History   Substance Use Topics   .  Smoking status:  Former Smoker     Quit date:  04/26/1962   .  Smokeless tobacco:  Not on file   .  Alcohol Use:  No    Allergies   Allergen  Reactions   .  Ivp Dye (Iodinated Diagnostic Agents)  Swelling     Only of the tongue   .  Other  Swelling     *veg dye* swelling only of the tongue    Current Outpatient Prescriptions   Medication  Sig  Dispense  Refill   .  allopurinol (ZYLOPRIM) 100 MG tablet  Take 2 tablets (200 mg total) by mouth 2 (two) times daily.  180 tablet  1   .  fluticasone (FLONASE) 50 MCG/ACT nasal spray  Two sprays each nostril daily for postnasal drip.  16 g  2   .  furosemide (LASIX) 40 MG tablet  Take 2 tablets (80 mg total) by mouth 2 (two) times daily. For leg swelling for 1 week and then we will recheck.  60 tablet  0   .  losartan-hydrochlorothiazide (HYZAAR) 100-25 MG per  tablet  Take 1 tablet by mouth daily.  90 tablet  0   .  potassium chloride SA (K-DUR,KLOR-CON) 20 MEQ tablet  Take 1 tablet (20 mEq total) by mouth 4 (four) times daily. For 7 days and recheck.  60 tablet  0   .  Tamsulosin HCl (FLOMAX) 0.4 MG CAPS  Take 1 capsule (0.4 mg total) by mouth at bedtime.  30 capsule  3   .  thyroid (ARMOUR) 15 MG tablet  Take 1 tablet (15 mg total) by mouth daily.  90 tablet  1   .  thyroid (ARMOUR) 30 MG tablet  Take 1 tablet (30 mg total) by mouth daily.  90 tablet  1    Review of Systems  Review of Systems  HENT: Positive for congestion.  Eyes: Positive for visual disturbance.  Respiratory: Negative.  Cardiovascular: Negative.  Gastrointestinal:  Positive for constipation. Negative for nausea, vomiting, abdominal pain, diarrhea, blood in stool, abdominal distention and anal bleeding.  Genitourinary: Positive for difficulty urinating.  Musculoskeletal: Positive for arthralgias.  Neurological: Negative.  Hematological: Negative.  Psychiatric/Behavioral: Negative.   Blood pressure 132/64, pulse 100, temperature 98.4 F (36.9 C), temperature source Temporal, resp. rate 20, height 5' 10.5" (1.791 m), weight 178 lb 9.6 oz (81.012 kg).  Physical Exam  Physical Exam  Constitutional: He is oriented to person, place, and time. He appears well-developed and well-nourished. No distress.  HENT:  Head: Normocephalic and atraumatic.  Right Ear: External ear normal.  Left Ear: External ear normal.  Nose: Nose normal.  Mouth/Throat: Oropharynx is clear and moist.  Eyes: Conjunctivae normal are normal. Pupils are equal, round, and reactive to light. Right eye exhibits no discharge. Left eye exhibits no discharge. No scleral icterus.  Neck: Normal range of motion. Neck supple. No tracheal deviation present. No thyromegaly present.  Cardiovascular: Normal rate, regular rhythm, normal heart sounds and intact distal pulses.  No murmur heard.  Pulmonary/Chest: Effort normal  and breath sounds normal. No respiratory distress. He has no wheezes. He has no rales.  Abdominal: Soft. Bowel sounds are normal. He exhibits no distension. There is no tenderness. There is no rebound.  There is an easily reducible moderate-sized left inguinal hernia. There is no right inguinal hernia. there is a tiny fascial defect at the umbilicus  Musculoskeletal: Normal range of motion. He exhibits no edema and no tenderness.  Lymphadenopathy:  He has no cervical adenopathy.  Neurological: He is alert and oriented to person, place, and time.  Skin: Skin is warm and dry. No rash noted. He is not diaphoretic. No erythema.  Psychiatric: His behavior is normal. Judgment normal.   Data Reviewed  Assessment   Left inguinal hernia   Plan   As he is symptomatic and the hernia is moderate in size, repair with mesh was recommended. I would recommend open repair. I discussed the risk of surgery which includes but is not limited to bleeding, infection, injury to surrounding structures, chronic pain, nerve entrapment, recurrence, etc. I also discussed postoperative recovery. He understands and wishes to proceed. Likelihood of success is good

## 2012-03-29 ENCOUNTER — Encounter (HOSPITAL_COMMUNITY): Payer: Self-pay | Admitting: *Deleted

## 2012-03-29 ENCOUNTER — Ambulatory Visit (HOSPITAL_COMMUNITY): Payer: Medicare Other | Admitting: Critical Care Medicine

## 2012-03-29 ENCOUNTER — Observation Stay (HOSPITAL_COMMUNITY)
Admission: RE | Admit: 2012-03-29 | Discharge: 2012-03-30 | Disposition: A | Discharge status: Home / Self Care | Payer: Medicare Other | Source: Ambulatory Visit | Attending: Surgery | Admitting: Surgery

## 2012-03-29 ENCOUNTER — Encounter (HOSPITAL_COMMUNITY): Payer: Self-pay | Admitting: Critical Care Medicine

## 2012-03-29 ENCOUNTER — Ambulatory Visit (HOSPITAL_COMMUNITY): Payer: Medicare Other

## 2012-03-29 ENCOUNTER — Ambulatory Visit (HOSPITAL_COMMUNITY): Admission: RE | Admit: 2012-03-29 | Payer: Medicare Other | Source: Ambulatory Visit | Admitting: Surgery

## 2012-03-29 ENCOUNTER — Encounter (HOSPITAL_COMMUNITY)
Admission: RE | Disposition: A | Discharge status: Home / Self Care | Payer: Self-pay | Source: Ambulatory Visit | Attending: Surgery

## 2012-03-29 ENCOUNTER — Encounter (HOSPITAL_COMMUNITY): Admission: RE | Payer: Self-pay | Source: Ambulatory Visit

## 2012-03-29 DIAGNOSIS — K4090 Unilateral inguinal hernia, without obstruction or gangrene, not specified as recurrent: Secondary | ICD-10-CM | POA: Diagnosis not present

## 2012-03-29 DIAGNOSIS — I10 Essential (primary) hypertension: Secondary | ICD-10-CM | POA: Insufficient documentation

## 2012-03-29 DIAGNOSIS — R918 Other nonspecific abnormal finding of lung field: Secondary | ICD-10-CM | POA: Diagnosis not present

## 2012-03-29 HISTORY — PX: INSERTION OF MESH: SHX5868

## 2012-03-29 HISTORY — PX: INGUINAL HERNIA REPAIR: SHX194

## 2012-03-29 HISTORY — DX: Bronchitis, not specified as acute or chronic: J40

## 2012-03-29 HISTORY — PX: INGUINAL HERNIA REPAIR: SUR1180

## 2012-03-29 HISTORY — DX: Other complications of anesthesia, initial encounter: T88.59XA

## 2012-03-29 HISTORY — DX: Adverse effect of unspecified anesthetic, initial encounter: T41.45XA

## 2012-03-29 LAB — CBC
MCH: 31.3 pg (ref 26.0–34.0)
MCHC: 34.1 g/dL (ref 30.0–36.0)
Platelets: 238 10*3/uL (ref 150–400)
RDW: 13.1 % (ref 11.5–15.5)

## 2012-03-29 SURGERY — REPAIR, HERNIA, INGUINAL, ADULT
Anesthesia: General | Laterality: Left

## 2012-03-29 SURGERY — REPAIR, HERNIA, INGUINAL, ADULT
Anesthesia: General | Site: Groin | Laterality: Left | Wound class: Clean

## 2012-03-29 MED ORDER — ONDANSETRON HCL 4 MG/2ML IJ SOLN
INTRAMUSCULAR | Status: DC | PRN
  Administered 2012-03-29: 4 mg via INTRAVENOUS

## 2012-03-29 MED ORDER — HYDROCODONE-ACETAMINOPHEN 5-325 MG PO TABS: 1.0000 | ORAL_TABLET | ORAL | Status: DC | PRN

## 2012-03-29 MED ORDER — LACTATED RINGERS IV SOLN
INTRAVENOUS | Status: DC
  Administered 2012-03-29: 21:00:00 via INTRAVENOUS

## 2012-03-29 MED ORDER — FENTANYL CITRATE 0.05 MG/ML IJ SOLN
INTRAMUSCULAR | Status: DC | PRN
  Administered 2012-03-29 (×4): 25 ug via INTRAVENOUS

## 2012-03-29 MED ORDER — ONDANSETRON HCL 4 MG/2ML IJ SOLN: 4.0000 mg | Freq: Four times a day (QID) | INTRAMUSCULAR | Status: DC | PRN

## 2012-03-29 MED ORDER — ONDANSETRON HCL 4 MG/2ML IJ SOLN: 4.0000 mg | Freq: Once | INTRAMUSCULAR | Status: DC | PRN

## 2012-03-29 MED ORDER — HYDROMORPHONE HCL PF 1 MG/ML IJ SOLN
0.2500 mg | INTRAMUSCULAR | Status: DC | PRN
  Administered 2012-03-29 (×2): 0.5 mg via INTRAVENOUS

## 2012-03-29 MED ORDER — LACTATED RINGERS IV SOLN
INTRAVENOUS | Status: DC | PRN
  Administered 2012-03-29: 13:00:00 via INTRAVENOUS

## 2012-03-29 MED ORDER — PHENYLEPHRINE HCL 10 MG/ML IJ SOLN
INTRAMUSCULAR | Status: DC | PRN
  Administered 2012-03-29 (×2): 40 ug via INTRAVENOUS

## 2012-03-29 MED ORDER — BUPIVACAINE-EPINEPHRINE 0.5% -1:200000 IJ SOLN
INTRAMUSCULAR | Status: DC | PRN
  Administered 2012-03-29: 20 mL

## 2012-03-29 MED ORDER — SODIUM CHLORIDE 0.9 % IV SOLN: 250.0000 mL | INTRAVENOUS | Status: DC | PRN

## 2012-03-29 MED ORDER — BUPIVACAINE-EPINEPHRINE (PF) 0.5% -1:200000 IJ SOLN
INTRAMUSCULAR | Status: AC
  Filled 2012-03-29: qty 10

## 2012-03-29 MED ORDER — TAMSULOSIN HCL 0.4 MG PO CAPS
0.4000 mg | ORAL_CAPSULE | Freq: Once | ORAL | Status: AC
  Administered 2012-03-29: 0.4 mg via ORAL
  Filled 2012-03-29: qty 1

## 2012-03-29 MED ORDER — OXYCODONE HCL 5 MG PO TABS
5.0000 mg | ORAL_TABLET | ORAL | Status: DC | PRN
  Administered 2012-03-30 (×2): 5 mg via ORAL
  Filled 2012-03-29: qty 2
  Filled 2012-03-29: qty 1

## 2012-03-29 MED ORDER — 0.9 % SODIUM CHLORIDE (POUR BTL) OPTIME
TOPICAL | Status: DC | PRN
  Administered 2012-03-29: 1000 mL

## 2012-03-29 MED ORDER — LIDOCAINE HCL (CARDIAC) 20 MG/ML IV SOLN
INTRAVENOUS | Status: DC | PRN
  Administered 2012-03-29: 70 mg via INTRAVENOUS

## 2012-03-29 MED ORDER — SODIUM CHLORIDE 0.9 % IJ SOLN
3.0000 mL | Freq: Two times a day (BID) | INTRAMUSCULAR | Status: DC
  Administered 2012-03-29: 3 mL via INTRAVENOUS

## 2012-03-29 MED ORDER — MORPHINE SULFATE 4 MG/ML IJ SOLN: 4.0000 mg | INTRAMUSCULAR | Status: DC | PRN

## 2012-03-29 MED ORDER — PROPOFOL 10 MG/ML IV BOLUS
INTRAVENOUS | Status: DC | PRN
  Administered 2012-03-29: 10 mg via INTRAVENOUS
  Administered 2012-03-29: 200 mg via INTRAVENOUS
  Administered 2012-03-29: 100 mg via INTRAVENOUS

## 2012-03-29 MED ORDER — ACETAMINOPHEN 325 MG PO TABS: 650.0000 mg | ORAL_TABLET | ORAL | Status: DC | PRN

## 2012-03-29 MED ORDER — ACETAMINOPHEN 650 MG RE SUPP: 650.0000 mg | RECTAL | Status: DC | PRN

## 2012-03-29 MED ORDER — HYDROMORPHONE HCL PF 1 MG/ML IJ SOLN
INTRAMUSCULAR | Status: AC
  Filled 2012-03-29: qty 1

## 2012-03-29 MED ORDER — SODIUM CHLORIDE 0.9 % IJ SOLN: 3.0000 mL | INTRAMUSCULAR | Status: DC | PRN

## 2012-03-29 SURGICAL SUPPLY — 45 items
APL SKNCLS STERI-STRIP NONHPOA (GAUZE/BANDAGES/DRESSINGS) ×1
BENZOIN TINCTURE PRP APPL 2/3 (GAUZE/BANDAGES/DRESSINGS) ×2 IMPLANT
BLADE SURG 10 STRL SS (BLADE) ×2 IMPLANT
BLADE SURG 15 STRL LF DISP TIS (BLADE) ×1 IMPLANT
BLADE SURG 15 STRL SS (BLADE) ×2
BLADE SURG ROTATE 9660 (MISCELLANEOUS) ×1 IMPLANT
CHLORAPREP W/TINT 26ML (MISCELLANEOUS) ×2 IMPLANT
CLOTH BEACON ORANGE TIMEOUT ST (SAFETY) ×2 IMPLANT
COVER SURGICAL LIGHT HANDLE (MISCELLANEOUS) ×2 IMPLANT
DRAIN PENROSE 1/2X12 LTX STRL (WOUND CARE) ×1 IMPLANT
DRAPE LAPAROTOMY TRNSV 102X78 (DRAPE) ×2 IMPLANT
DRESSING TELFA 8X3 (GAUZE/BANDAGES/DRESSINGS) ×2 IMPLANT
DRSG TEGADERM 4X4.75 (GAUZE/BANDAGES/DRESSINGS) ×2 IMPLANT
ELECT CAUTERY BLADE 6.4 (BLADE) ×2 IMPLANT
ELECT REM PT RETURN 9FT ADLT (ELECTROSURGICAL) ×2
ELECTRODE REM PT RTRN 9FT ADLT (ELECTROSURGICAL) ×1 IMPLANT
GLOVE BIO SURGEON STRL SZ7.5 (GLOVE) ×1 IMPLANT
GLOVE BIOGEL PI IND STRL 7.5 (GLOVE) IMPLANT
GLOVE BIOGEL PI INDICATOR 7.5 (GLOVE) ×1
GLOVE SURG SIGNA 7.5 PF LTX (GLOVE) ×2 IMPLANT
GOWN PREVENTION PLUS XLARGE (GOWN DISPOSABLE) ×2 IMPLANT
GOWN STRL NON-REIN LRG LVL3 (GOWN DISPOSABLE) ×2 IMPLANT
KIT BASIN OR (CUSTOM PROCEDURE TRAY) ×2 IMPLANT
KIT ROOM TURNOVER OR (KITS) ×2 IMPLANT
MESH PARIETEX PROGRIP LEFT (Mesh General) ×1 IMPLANT
NDL HYPO 25GX1X1/2 BEV (NEEDLE) ×1 IMPLANT
NEEDLE HYPO 25GX1X1/2 BEV (NEEDLE) ×2 IMPLANT
NS IRRIG 1000ML POUR BTL (IV SOLUTION) ×2 IMPLANT
PACK SURGICAL SETUP 50X90 (CUSTOM PROCEDURE TRAY) ×2 IMPLANT
PAD ARMBOARD 7.5X6 YLW CONV (MISCELLANEOUS) ×4 IMPLANT
PENCIL BUTTON HOLSTER BLD 10FT (ELECTRODE) ×2 IMPLANT
SPECIMEN JAR SMALL (MISCELLANEOUS) IMPLANT
SPONGE LAP 18X18 X RAY DECT (DISPOSABLE) ×2 IMPLANT
STRIP CLOSURE SKIN 1/2X4 (GAUZE/BANDAGES/DRESSINGS) ×2 IMPLANT
SUT MON AB 4-0 PC3 18 (SUTURE) ×3 IMPLANT
SUT SILK 2 0 SH (SUTURE) ×1 IMPLANT
SUT VIC AB 2-0 CT1 27 (SUTURE) ×2
SUT VIC AB 2-0 CT1 TAPERPNT 27 (SUTURE) ×2 IMPLANT
SUT VIC AB 3-0 CT1 27 (SUTURE) ×2
SUT VIC AB 3-0 CT1 TAPERPNT 27 (SUTURE) ×1 IMPLANT
SYR CONTROL 10ML LL (SYRINGE) ×2 IMPLANT
TOWEL OR 17X24 6PK STRL BLUE (TOWEL DISPOSABLE) ×1 IMPLANT
TOWEL OR 17X26 10 PK STRL BLUE (TOWEL DISPOSABLE) ×2 IMPLANT
TOWEL OR NON WOVEN STRL DISP B (DISPOSABLE) ×1 IMPLANT
WATER STERILE IRR 1000ML POUR (IV SOLUTION) IMPLANT

## 2012-03-29 NOTE — Anesthesia Preprocedure Evaluation (Signed)
Anesthesia Evaluation  Patient identified by MRN, date of birth, ID band Patient awake    Reviewed: Allergy & Precautions, H&P , NPO status , Patient's Chart, lab work & pertinent test results  Airway       Dental  (+) Dental Advisory Given   Pulmonary          Cardiovascular hypertension,     Neuro/Psych    GI/Hepatic   Endo/Other  Hypothyroidism   Renal/GU Renal InsufficiencyRenal disease     Musculoskeletal   Abdominal   Peds  Hematology   Anesthesia Other Findings   Reproductive/Obstetrics                           Anesthesia Physical Anesthesia Plan  ASA: III  Anesthesia Plan: General   Post-op Pain Management:    Induction: Intravenous  Airway Management Planned: LMA  Additional Equipment:   Intra-op Plan:   Post-operative Plan: Extubation in OR  Informed Consent: I have reviewed the patients History and Physical, chart, labs and discussed the procedure including the risks, benefits and alternatives for the proposed anesthesia with the patient or authorized representative who has indicated his/her understanding and acceptance.   Dental advisory given  Plan Discussed with: Anesthesiologist and Surgeon  Anesthesia Plan Comments:         Anesthesia Quick Evaluation

## 2012-03-29 NOTE — Op Note (Signed)
HERNIA REPAIR INGUINAL ADULT, INSERTION OF MESH  Procedure Note  Latron Ribas. 03/29/2012   Pre-op Diagnosis: Left inguinal hernia     Post-op Diagnosis: same  Procedure(s): HERNIA REPAIR INGUINAL ADULT INSERTION OF MESH  Surgeon(s): Shelly Rubenstein, MD  Anesthesia: General  Staff:  Eppie Gibson, RN - Circulator Janeece Agee Pingue, CST - Scrub Person  Estimated Blood Loss: Minimal               Procedure: The patient was brought to the operating room and identified as the correct patient. He was placed supine on the operating room table and general anesthesia was induced. His abdomen was then prepped and draped in the usual sterile fashion. A performed a left ilioinguinal nerve block with Marcaine. I anesthetized the skin in the left groin with Marcaine as well. I then made a longitudinal incision with a scalpel. I took this down through the subcutaneous tissue and Scarpa's fascia with electrocautery. I then opened the external oblique fascia toward the internal and external rings. The testicular cord and structures were controlled with a Penrose drain. The patient had a large indirect hernia sac which I reduced from the cord structures. I tied off the base of the sac with a silk suture. I then excised the redundant sac with electrocautery. Next a piece of Prolene PRO- grip mesh was brought to the field. I placed around the cord structures and fixated to the inguinal floor. I then sutured it to the pubic tubercle with a Vicryl suture. Good coverage of the inguinal floor appeared to be achieved. I closed the external oblique fascia over the top of the mesh with a running 3-0 Vicryl suture. I anesthetized the fascia further with Marcaine. I closed Scarpa's fascia with interrupted 3-0 Vicryl sutures and closed the skin with a running 4-0 Monocryl. Steri-Strips, gauze, and Tegaderm were then applied. The patient tolerated the procedure well. All the counts were correct at the  end of the procedure. The patient was then extubated in the operating room and taken in a stable condition to the recovery room.          Illene Sweeting A   Date: 03/29/2012  Time: 1:39 PM

## 2012-03-29 NOTE — Interval H&P Note (Signed)
History and Physical Interval Note: no change in H and P  03/29/2012 10:17 AM  Brent Morrison.  has presented today for surgery, with the diagnosis of Left inguinal hernia  The various methods of treatment have been discussed with the patient and family. After consideration of risks, benefits and other options for treatment, the patient has consented to  Procedure(s) (LRB) with comments: HERNIA REPAIR INGUINAL ADULT (Left) INSERTION OF MESH (Left) as a surgical intervention .  The patient's history has been reviewed, patient examined, no change in status, stable for surgery.  I have reviewed the patient's chart and labs.  Questions were answered to the patient's satisfaction.     [name]

## 2012-03-29 NOTE — Anesthesia Postprocedure Evaluation (Signed)
Anesthesia Post Note  Patient: Brent Morrison.  Procedure(s) Performed: Procedure(s) (LRB): HERNIA REPAIR INGUINAL ADULT (Left) INSERTION OF MESH (Left)  Anesthesia type: general  Patient location: PACU  Post pain: Pain level controlled  Post assessment: Patient's Cardiovascular Status Stable  Last Vitals:  Filed Vitals:   03/29/12 1504  BP: 128/80  Pulse: 76  Temp: 36.4 C  Resp: 16    Post vital signs: Reviewed and stable  Level of consciousness: sedated  Complications: No apparent anesthesia complications

## 2012-03-29 NOTE — Progress Notes (Signed)
REPORT CALLED TO JESSICA,RN AND TRANSFERRED TO [phone] VIA WHEELCHAIR

## 2012-03-29 NOTE — Progress Notes (Signed)
IN AND OUT CATH WITH #15F FOLEY BY RENEE RICHARDS,RN AND TOL WELL

## 2012-03-29 NOTE — Transfer of Care (Signed)
Immediate Anesthesia Transfer of Care Note  Patient: Brent Morrison.  Procedure(s) Performed: Procedure(s) (LRB) with comments: HERNIA REPAIR INGUINAL ADULT (Left) INSERTION OF MESH (Left)  Patient Location: PACU  Anesthesia Type:General  Level of Consciousness: awake, alert  and oriented  Airway & Oxygen Therapy: Patient Spontanous Breathing and Patient connected to nasal cannula oxygen  Post-op Assessment: Report given to PACU RN, Post -op Vital signs reviewed and stable and Patient moving all extremities X 4  Post vital signs: Reviewed and stable  Complications: No apparent anesthesia complications

## 2012-03-29 NOTE — Progress Notes (Signed)
DR WYATT NOTIFIED OF CLIENT UNABLE TO VOID AND IN AND OUT CATH DONE AS ORDERED  WITH 200CC CLEAR YELLOW OUT AND CLIENT REQUESTING TO STAY OVERNIGHT AND ORDERS NOTED

## 2012-03-29 NOTE — Anesthesia Procedure Notes (Signed)
Procedure Name: LMA Insertion Date/Time: 03/29/2012 1:02 PM Performed by: Elon Alas Pre-anesthesia Checklist: Timeout performed, Patient identified, Emergency Drugs available, Suction available and Patient being monitored Patient Re-evaluated:Patient Re-evaluated prior to inductionOxygen Delivery Method: Circle system utilized Preoxygenation: Pre-oxygenation with 100% oxygen Intubation Type: IV induction LMA: LMA inserted LMA Size: 4.0 Number of attempts: 1 Placement Confirmation: positive ETCO2 and breath sounds checked- equal and bilateral Tube secured with: Tape Dental Injury: Teeth and Oropharynx as per pre-operative assessment

## 2012-03-29 NOTE — Preoperative (Signed)
Beta Blockers   Reason not to administer Beta Blockers:Not Applicable 

## 2012-03-30 ENCOUNTER — Other Ambulatory Visit: Payer: Self-pay

## 2012-03-30 ENCOUNTER — Encounter (HOSPITAL_COMMUNITY): Payer: Self-pay | Admitting: Surgery

## 2012-03-30 MED ORDER — TAMSULOSIN HCL 0.4 MG PO CAPS: 0.4000 mg | ORAL_CAPSULE | Freq: Every day | ORAL | Status: DC

## 2012-03-30 NOTE — Progress Notes (Signed)
Patient ID: Brent Cosman., male   DOB: Oct 27, 1932, 78 y.o.   MRN: [id number]  POD#1  Doing better.  Voiding on own Abdomen soft, dressing ok  Plan  discharge

## 2012-03-30 NOTE — Progress Notes (Signed)
Discharge instructions/Med Rec Sheet reviewed w/ pt. Pt expressed understanding and copies given w/ prescriptions. Pt d/c'd in stable condition via w/c, accompanied by discharge volunteers 

## 2012-03-30 NOTE — Telephone Encounter (Signed)
Dr Ivan Anchors has left for the day. Message being sent to Dr Benjamin Stain.

## 2012-03-30 NOTE — Discharge Summary (Signed)
Physician Discharge Summary  Patient ID: Brent Morrison. MRN: [id number] DOB/AGE: [birth year] 78 y.o.  Admit date: 03/29/2012 Discharge date: 03/30/2012  Admission Diagnoses:  Discharge Diagnoses:  Active Problems:  * No active hospital problems. *  post op urinary retention  Discharged Condition: good  Hospital Course: admitted to observation for post op urinary retention.  Resolved quickly.   Discharge POD#1  Consults: None  Significant Diagnostic Studies:   Treatments: surgery:   Discharge Exam: Blood pressure 107/60, pulse 87, temperature 98.4 F (36.9 C), temperature source Oral, resp. rate 18, height 5' 10.5" (1.791 m), weight 167 lb 5.3 oz (75.9 kg), SpO2 96.00%. General appearance: alert and cooperative GI: soft, non-tender; bowel sounds normal; no masses,  no organomegaly  Disposition: 01-Home or Self Care  Discharge Orders    Future Appointments: Provider: Department: Dept Phone: Center:   04/14/2012 1:30 PM Shelly Rubenstein, MD [facility], [place] [phone] None       Medication List     As of 03/30/2012 11:49 AM    TAKE these medications         allopurinol 100 MG tablet   Commonly known as: ZYLOPRIM   Take 100 mg by mouth 2 (two) times daily.      cholecalciferol 1000 UNITS tablet   Commonly known as: VITAMIN D   Take 1,000 Units by mouth daily.      HYDROcodone-acetaminophen 5-325 MG per tablet   Commonly known as: NORCO/VICODIN   Take 1-2 tablets by mouth every 4 (four) hours as needed for pain.      losartan-hydrochlorothiazide 100-25 MG per tablet   Commonly known as: HYZAAR   Take 1 tablet by mouth daily before breakfast.      saw palmetto 160 MG capsule   Take 160 mg by mouth 2 (two) times daily.      Tamsulosin HCl 0.4 MG Caps   Commonly known as: FLOMAX   Take 0.4 mg by mouth at bedtime.      thyroid 15 MG tablet   Commonly known as: ARMOUR   Take 15 mg by mouth daily before breakfast. Takes with 30 mg to equal 45  mg      thyroid 30 MG tablet   Commonly known as: ARMOUR   Take 30 mg by mouth daily before breakfast. Takes with 15 mg to equal 45 mg      vitamin B-12 250 MCG tablet   Commonly known as: CYANOCOBALAMIN   Take 250 mcg by mouth once a week.      vitamin C 500 MG tablet   Commonly known as: ASCORBIC ACID   Take 500 mg by mouth daily.           Follow-up Information    Follow up with [facility] A, MD. Call in 3 weeks.   Contact information:   [address] [place] [address] [phone]          Signed: Shelly Rubenstein 03/30/2012, 11:49 AM

## 2012-03-30 NOTE — Telephone Encounter (Signed)
Brent Morrison's girlfriend is here to pick up a written prescription for his flomax. Brent Morrison is in the car waiting. He was just released from the hospital. He had surgery, inguinal hernia repair. He was given flomax at the hospital because he was unable to void. He wants it written due to the fact that he is going to shop around for the cheapest price. Medication has never been written before by Dr Ivan Anchors. Note and prescription being sent to Dr Ivan Anchors verify appropriateness.

## 2012-04-05 ENCOUNTER — Ambulatory Visit (INDEPENDENT_AMBULATORY_CARE_PROVIDER_SITE_OTHER): Payer: Medicare Other

## 2012-04-05 ENCOUNTER — Ambulatory Visit (INDEPENDENT_AMBULATORY_CARE_PROVIDER_SITE_OTHER): Payer: Medicare Other | Admitting: Family Medicine

## 2012-04-05 ENCOUNTER — Encounter: Payer: Self-pay | Admitting: Family Medicine

## 2012-04-05 VITALS — BP 118/78 | HR 87 | Wt 167.0 lb

## 2012-04-05 DIAGNOSIS — S8990XA Unspecified injury of unspecified lower leg, initial encounter: Secondary | ICD-10-CM | POA: Diagnosis not present

## 2012-04-05 DIAGNOSIS — M25469 Effusion, unspecified knee: Secondary | ICD-10-CM | POA: Diagnosis not present

## 2012-04-05 DIAGNOSIS — M25569 Pain in unspecified knee: Secondary | ICD-10-CM | POA: Diagnosis not present

## 2012-04-05 DIAGNOSIS — N139 Obstructive and reflux uropathy, unspecified: Secondary | ICD-10-CM | POA: Diagnosis not present

## 2012-04-05 DIAGNOSIS — S99929A Unspecified injury of unspecified foot, initial encounter: Secondary | ICD-10-CM | POA: Diagnosis not present

## 2012-04-05 DIAGNOSIS — M25562 Pain in left knee: Secondary | ICD-10-CM

## 2012-04-05 NOTE — Progress Notes (Signed)
CC: Brent Morrison. is a 78 y.o. male is here for left leg pain   Subjective: HPI:  Yesterday afternoon while getting off the couch patient felt a sudden sharp pain in his left knee. It is localized to the inferior lateral anterior aspect of the knee. It is worse with knee flexion, improves with rest. Mild swelling at the time of the pain however he feels this is decreased substantially with heating pads. Pain is 8/10 in severity. He has no trouble bearing weight. There has been no redness or warmth to the knee. There has been no locking catching or giving way. He has never had an injury to this knee before, he has never had this pain before. Symptoms are present all hours of the day, not interfering with sleep.  Denies fevers, chills, groin pain, left ankle pain, nor leg swelling or skin changes.  When he was discharged last week from his hernia operation he had some urinary retention that was relieved with 12 hours of observation and starting Flomax. He is back to taking Flomax on a daily basis denies any urinary complaints. Denies incomplete voiding, straining to urinate, nor urinating more than 8 times a day.    Review Of Systems Outlined In HPI  Past Medical History  Diagnosis Date  . Hypothyroidism   . Hypertension   . Gout   . BPH (benign prostatic hyperplasia) 03-20-12    difficult to void after surgery( if catherterized, desires to stay overnight).  . Allergy     occ. dry cough  . 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 Complication of anesthesia     "difficulty with urination after anesthesia"  . Bronchitis     "had it when I was a little boy; I out grew that" (03/29/2012)     Family History  Problem Relation Age of Onset  . Heart disease Mother     >43  . Heart disease Father     >65  . Diabetes Sister      History  Substance Use Topics  . Smoking status: Former Smoker -- 1.0 packs/day for 10 years   Types: Cigarettes    Quit date: 04/26/1961  . Smokeless tobacco: Never Used  . Alcohol Use: No     Objective: Filed Vitals:   04/05/12 1434  BP: 118/78  Pulse: 87    General: Alert and Oriented, No Acute Distress Lungs: Clear to auscultation bilaterally, no wheezing/ronchi/rales.  Comfortable work of breathing. Good air movement. Cardiac: Regular rate and rhythm. Normal S1/S2.  No murmurs, rubs, nor gallops.   Extremities: No peripheral edema.  Strong peripheral pulses. Left knee is nonswollen, without erythema and and without heat. Full range of motion strength of the knee. Valgus and varus stress do not show laxity nor cause pain. Anterior drawer negative, Lachman negative. Patient unwilling to get in a position for McMurray. Pain is reproduced with direct anterior posterior movements of the talo fibular joint. Mental Status: No depression, anxiety, nor agitation. Skin: Warm and dry.  Assessment & Plan: Park was seen today for left leg pain.  Diagnoses and associated orders for this visit:  Urinary obstruction unspecified  Left knee pain - DG Knee Complete 4 Views Left; Future    Personal interpretation of plain films shows no abnormalities surrounding the site of his pain specifically the proximal tibiofibular junction, normal fibular head. There is mild joint space narrowing in the lateral compartment. Findings were discussed  with the patient. He declines knee bracing, we went over range of motion exercises to continue with. He declines pharmaceutical device. I suspect his pain is due to biomechanical and gait changes during the recovery period of his left inguinal hernia repair Urinary obstruction: Stable and improved with Flomax.    Return in about 2 weeks (around 04/19/2012).

## 2012-04-14 ENCOUNTER — Ambulatory Visit (INDEPENDENT_AMBULATORY_CARE_PROVIDER_SITE_OTHER): Payer: Medicare Other | Admitting: Surgery

## 2012-04-14 ENCOUNTER — Encounter (INDEPENDENT_AMBULATORY_CARE_PROVIDER_SITE_OTHER): Payer: Self-pay | Admitting: Surgery

## 2012-04-14 VITALS — BP 112/66 | HR 84 | Temp 97.0°F | Resp 16 | Ht 70.5 in | Wt 169.6 lb

## 2012-04-14 DIAGNOSIS — Z09 Encounter for follow-up examination after completed treatment for conditions other than malignant neoplasm: Secondary | ICD-10-CM

## 2012-04-14 NOTE — Progress Notes (Signed)
Subjective:     Patient ID: Brent Vroom., male   DOB: 07-25-1932, 78 y.o.   MRN: [id number]  HPI He is here for his first postop visit status post left inguinal hernia repair with mesh. He had to be admitted for urinary retention. This is now resolved and he is doing well  Review of Systems     Objective:   Physical Exam On exam, his incision is well healed and there is no evidence of recurrence    Assessment:     Patient stable postop    Plan:     He will refrain from heavy lifting until the first of the year. He will see me as needed

## 2012-04-20 ENCOUNTER — Other Ambulatory Visit: Payer: Self-pay | Admitting: Family Medicine

## 2012-05-12 ENCOUNTER — Ambulatory Visit (INDEPENDENT_AMBULATORY_CARE_PROVIDER_SITE_OTHER): Payer: Medicare Other | Admitting: Family Medicine

## 2012-05-12 VITALS — BP 116/70 | HR 79 | Wt 169.0 lb

## 2012-05-12 DIAGNOSIS — L272 Dermatitis due to ingested food: Secondary | ICD-10-CM

## 2012-05-12 MED ORDER — PREDNISONE 20 MG PO TABS: ORAL_TABLET | ORAL | Status: AC

## 2012-05-12 MED ORDER — METHYLPREDNISOLONE SODIUM SUCC 125 MG IJ SOLR
125.0000 mg | Freq: Once | INTRAMUSCULAR | Status: AC
  Administered 2012-05-12: 125 mg via INTRAMUSCULAR

## 2012-05-12 NOTE — Progress Notes (Signed)
CC: Brent Morrison. is a 79 y.o. male is here for Rash   Subjective: HPI:  Patient reports a rash on the back and breasts and lower legs that has been present since Monday of this week and getting worse on a daily basis. Is itchy but painless. Itching is moderate in severity. It often wakes him at night with the sensation to scratch. Itching improves with scratching only temporarily. No interventions as of yet. He's never had this before. Symptoms are present all hours of the day. Nothing else other than above Or worse. No new personal care products or detergents. No exposure to plants or new animals. 2-3 days before the rash started he was having tomato soup on a daily basis and in hindsight remembers that he is allergic to tomatoes. Also started a new multivitamin 2-3 days before the rash and has stopped taking them once the rash began. Denies wheezing, facial swelling, tongue or mouth swelling, shortness of breath, flushing, rapid heartbeat, fevers, chills, nausea, vomiting, joint or muscle pain, nor rashes elsewhere    Review Of Systems Outlined In HPI  Past Medical History  Diagnosis Date  . Hypothyroidism   . Hypertension   . Gout   . BPH (benign prostatic hyperplasia) 03-20-12    difficult to void after surgery( if catherterized, desires to stay overnight).  . Allergy     occ. dry cough  . 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 Complication of anesthesia     "difficulty with urination after anesthesia"  . Bronchitis     "had it when I was a little boy; I out grew that" (03/29/2012)     Family History  Problem Relation Age of Onset  . Heart disease Mother     >102  . Heart disease Father     >65  . Diabetes Sister      History  Substance Use Topics  . Smoking status: Former Smoker -- 1.0 packs/day for 10 years    Types: Cigarettes    Quit date: 04/26/1961  . Smokeless tobacco: Never Used  . Alcohol Use: No      Objective: Filed Vitals:   05/12/12 1437  BP: 116/70  Pulse: 79    General: Alert and Oriented, No Acute Distress HEENT: Pupils equal, round, reactive to light. Conjunctivae clear.   Moist mucous membranes, pharynx without inflammation nor lesions.  Neck supple without palpable lymphadenopathy nor abnormal masses. No tongue or lip swelling Lungs: Clear to auscultation bilaterally, no wheezing/ronchi/rales.  Comfortable work of breathing. Good air movement. Cardiac: Regular rate and rhythm. Normal S1/S2.  No murmurs, rubs, nor gallops.   Extremities: No peripheral edema.  Strong peripheral pulses.  Mental Status: No depression, anxiety, nor agitation. Skin: Warm and dry. Diffuse erythematous macular papular rash with excoriations on the upper back near the scapula and on the right lateral breast. Similar rash on the front of the left shin  Assessment & Plan: Brent Morrison was seen today for rash.  Diagnoses and associated orders for this visit:  Food allergic skin reaction - predniSONE (DELTASONE) 20 MG tablet; Three tabs daily days 1-3, two tabs daily days 4-6, one tab daily days 7-9, half tab daily days 10-13. - methylPREDNISolone sodium succinate (SOLU-MEDROL) 125 mg/2 mL injection 125 mg; Inject 2 mLs (125 mg total) into the muscle once.    Discussed with patient that rash is most likely do to consumption of food product which he is  allergic to, likely tomato but can't rule out any ingredient from the multivitamin. Asked him to hold off on both of them start prednisone taper, Zyrtec and ranitidine as needed for itching, given Solu-Medrol as he may not be able to obtain the prednisone until tomorrow morning  Return if symptoms worsen or fail to improve.

## 2012-05-15 ENCOUNTER — Encounter: Payer: Self-pay | Admitting: Physician Assistant

## 2012-05-15 ENCOUNTER — Ambulatory Visit (INDEPENDENT_AMBULATORY_CARE_PROVIDER_SITE_OTHER): Payer: Medicare Other | Admitting: Physician Assistant

## 2012-05-15 ENCOUNTER — Ambulatory Visit: Payer: Medicare Other | Admitting: Family Medicine

## 2012-05-15 ENCOUNTER — Telehealth: Payer: Self-pay | Admitting: *Deleted

## 2012-05-15 VITALS — BP 143/90 | HR 102 | Wt 172.0 lb

## 2012-05-15 DIAGNOSIS — M109 Gout, unspecified: Secondary | ICD-10-CM

## 2012-05-15 DIAGNOSIS — R34 Anuria and oliguria: Secondary | ICD-10-CM

## 2012-05-15 DIAGNOSIS — N401 Enlarged prostate with lower urinary tract symptoms: Secondary | ICD-10-CM

## 2012-05-15 DIAGNOSIS — I10 Essential (primary) hypertension: Secondary | ICD-10-CM

## 2012-05-15 DIAGNOSIS — N138 Other obstructive and reflux uropathy: Secondary | ICD-10-CM

## 2012-05-15 LAB — POCT URINALYSIS DIPSTICK
Bilirubin, UA: NEGATIVE
Glucose, UA: NEGATIVE
Ketones, UA: NEGATIVE
Leukocytes, UA: NEGATIVE

## 2012-05-15 MED ORDER — FINASTERIDE 5 MG PO TABS: 5.0000 mg | ORAL_TABLET | Freq: Every day | ORAL | Status: DC

## 2012-05-15 MED ORDER — TAMSULOSIN HCL 0.4 MG PO CAPS: 0.4000 mg | ORAL_CAPSULE | Freq: Every day | ORAL | Status: DC

## 2012-05-15 MED ORDER — ALLOPURINOL 100 MG PO TABS: 100.0000 mg | ORAL_TABLET | Freq: Every day | ORAL | Status: DC

## 2012-05-15 NOTE — Telephone Encounter (Signed)
Pt states the allopurinol 100mg  bid upsets his stomach. Will the medication still be effective if he takes it once a day?

## 2012-05-15 NOTE — Telephone Encounter (Signed)
It won't be as effective but if he would like to give it a go I think it would still provide some benefit.  We'll just need to check a uric acid level in a few months if he makes this change.

## 2012-05-15 NOTE — Progress Notes (Signed)
  Subjective:    Patient ID: Brent Postlewait., male    DOB: June 25, 1932, 79 y.o.   MRN: [id number]  HPI Patient comes in today due to not be able to produce as much urine as he usually dose. He drinks a lot of water and normally goes to bathroom 4-6 times a day. He states he is still going that many times but not as much output. Denies any urinary pain, discomfort, fever, chills, back pain. He has not been taking his flomax regularly. He states " i only take that when needed".  Denies any CP, palpitations, SOB.   Taking alloupurinol twice a day and not able to tolerate due to GI side effects. Would like to decrease to once a day. Ongoing gout that has been controlled for the past couple of months.    Review of Systems     Objective:   Physical Exam  Constitutional: He appears well-developed and well-nourished.  HENT:  Head: Normocephalic and atraumatic.  Cardiovascular: Regular rhythm and normal heart sounds.        Tachycardia.  Pulmonary/Chest: Effort normal and breath sounds normal. He has no wheezes.       No CVA tenderness.  Abdominal: Soft. He exhibits no distension and no mass. There is no tenderness. There is no rebound and no guarding.  Skin: Skin is warm and dry.  Psychiatric: He has a normal mood and affect. His behavior is normal.          Assessment & Plan:  Urinary obstruction due to BPH- UA was negative for any leuks, blood, nitrates. Cup was full and pt reported that he poured some out. When asked he stated he urinated like that 4 times a day. Very encouraging that pt does not need catherization. encouraged patient to stay on Flomax daily and added Proscar. Educated patient that these medication shrink prostate so that you do not have urine output problems. Always call if worried about output. Pt aware. I would like to start medication and follow up with Dr. Ivan Anchors in 2 weeks.   HTN- Pt declined any medication changes today. Encouraged to keep low salt diet and  follow up on BP to make sure staying controlled with Dr. Ivan Anchors in 2 weeks.   Gout- Gave pt rx for allopurinol 100mg  once a day since not able to tolerate. Discussed would not be as effective and would need to recheck uric acid in couple of months to make sure normal.

## 2012-05-15 NOTE — Patient Instructions (Signed)
Start taking Flomax and Finasteride daily.

## 2012-05-16 NOTE — Telephone Encounter (Signed)
Brent Morrison went over this with him at his office visit yesterday

## 2012-05-29 ENCOUNTER — Ambulatory Visit (INDEPENDENT_AMBULATORY_CARE_PROVIDER_SITE_OTHER): Payer: Medicare Other | Admitting: Family Medicine

## 2012-05-29 ENCOUNTER — Encounter: Payer: Self-pay | Admitting: Family Medicine

## 2012-05-29 VITALS — BP 99/59 | HR 97 | Wt 171.0 lb

## 2012-05-29 DIAGNOSIS — Z131 Encounter for screening for diabetes mellitus: Secondary | ICD-10-CM | POA: Diagnosis not present

## 2012-05-29 DIAGNOSIS — M1A00X0 Idiopathic chronic gout, unspecified site, without tophus (tophi): Secondary | ICD-10-CM

## 2012-05-29 DIAGNOSIS — R252 Cramp and spasm: Secondary | ICD-10-CM | POA: Diagnosis not present

## 2012-05-29 DIAGNOSIS — M1A9XX0 Chronic gout, unspecified, without tophus (tophi): Secondary | ICD-10-CM | POA: Diagnosis not present

## 2012-05-29 DIAGNOSIS — N401 Enlarged prostate with lower urinary tract symptoms: Secondary | ICD-10-CM

## 2012-05-29 DIAGNOSIS — N138 Other obstructive and reflux uropathy: Secondary | ICD-10-CM

## 2012-05-29 DIAGNOSIS — H60399 Other infective otitis externa, unspecified ear: Secondary | ICD-10-CM | POA: Diagnosis not present

## 2012-05-29 NOTE — Progress Notes (Signed)
CC: Brent Morrison. is a 79 y.o. male is here for Benign Prostatic Hypertrophy and wants potassium and sugar checked   Subjective: HPI: Patient presents with concerns of cramp in the right thigh. Described as a 10 minute episode occurring last night localized to the anterior quadricep. Came on without warning and improves greatly with walking and stretching. Has not had in this location before, denies recent cramping otherwise. Symptoms were described as moderate in severity. Nothing else make better or worse. Some soreness remains there today which is very mild in severity. Denies motor or sensory disturbances, weakness, knee pain, hip pain, groin pain, skin changes, weakness, nor change in medication regimen.  History of gout: He's taking allopurinol 200 mg a day and would like to decrease dosage to 100 mg a day as he gets a unsteady stomach soon after taking the 200 mg regimen. In the past month he denies any recent gout flares or joint pain.  He was just told that his brother and sister were diagnosed with type 2 diabetes. He would like to be screened today possible. He denies polyphagia polydipsia nor polyuria. He denies poorly healing wounds or vision loss.  At his last visit finasteride was started for BPH. He is within a week after taking this he's had a great improvement with the stream of his urine and emptying of his bladder. He thinks he is urinating 5-8 times a day. He denies straining to urinate or sensation of incomplete voiding. He is quite happy on any medication    Review Of Systems Outlined In HPI  Past Medical History  Diagnosis Date  . Hypothyroidism   . Hypertension   . Gout   . BPH (benign prostatic hyperplasia) 03-20-12    difficult to void after surgery( if catherterized, desires to stay overnight).  . Allergy     occ. dry cough  . 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 Complication of anesthesia    "difficulty with urination after anesthesia"  . Bronchitis     "had it when I was a little boy; I out grew that" (03/29/2012)     Family History  Problem Relation Age of Onset  . Heart disease Mother     >50  . Heart disease Father     >65  . Diabetes Sister      History  Substance Use Topics  . Smoking status: Former Smoker -- 1.0 packs/day for 10 years    Types: Cigarettes    Quit date: 04/26/1961  . Smokeless tobacco: Never Used  . Alcohol Use: No     Objective: Filed Vitals:   05/29/12 1307  BP: 99/59  Pulse: 97    General: Alert and Oriented, No Acute Distress HEENT: Pupils equal, round, reactive to light. Conjunctivae clear.  Pharynx unremarkable, moist mucous membranes Lungs: Clear to auscultation bilaterally, no wheezing/ronchi/rales.  Comfortable work of breathing. Good air movement. Cardiac: Regular rate and rhythm. Normal S1/S2.  No murmurs, rubs, nor gallops.   Extremities: No peripheral edema.  Strong peripheral pulses. Full range of motion strength of the right quadricep and knee. No pain with palpation of the right quadricep. Mental Status: No depression, anxiety, nor agitation. Skin: Warm and dry.  Assessment & Plan: Brent Morrison was seen today for benign prostatic hypertrophy and wants potassium and sugar checked.  Diagnoses and associated orders for this visit:  Cramp in limb - Magnesium - COMPLETE METABOLIC PANEL WITH GFR  Chronic  gout - Uric acid  Screening for diabetes mellitus  Benign prostatic hypertrophy, with urinary obstruction    Cramp in limb: Will rule out reversible causes such as electrolyte abnormalities given that he is on hydrochlorothiazide. Chronic gout: Stable, He will go down to allopurinol 100 mg a day, will obtain uric acid today as a baseline to compare to the new regimen in 3 months. Discussed that we may need to stop hydrochlorothiazide in the future BPH with urinary junction: Clinically improved, continue Flomax  andfinasteride Screen for type 2 diabetes with glucose today  Return in about 2 months (around 07/27/2012).

## 2012-05-30 ENCOUNTER — Encounter: Payer: Self-pay | Admitting: Family Medicine

## 2012-05-30 LAB — COMPLETE METABOLIC PANEL WITH GFR
ALT: 12 U/L (ref 0–53)
AST: 13 U/L (ref 0–37)
Albumin: 3.4 g/dL — ABNORMAL LOW (ref 3.5–5.2)
Alkaline Phosphatase: 62 U/L (ref 39–117)
BUN: 19 mg/dL (ref 6–23)
Potassium: 3.8 mEq/L (ref 3.5–5.3)
Sodium: 135 mEq/L (ref 135–145)

## 2012-05-30 LAB — MAGNESIUM: Magnesium: 1.8 mg/dL (ref 1.5–2.5)

## 2012-05-30 LAB — URIC ACID: Uric Acid, Serum: 6.4 mg/dL (ref 4.0–7.8)

## 2012-06-01 ENCOUNTER — Other Ambulatory Visit: Payer: Self-pay | Admitting: *Deleted

## 2012-06-01 ENCOUNTER — Telehealth: Payer: Self-pay | Admitting: *Deleted

## 2012-06-01 MED ORDER — COLCHICINE 0.6 MG PO TABS: ORAL_TABLET | ORAL | Status: DC

## 2012-06-01 NOTE — Telephone Encounter (Signed)
I'd prefer he not receive indomethacin since the allergy list reports tongue swelling.  Is it possible to cancel [facility]?  I'd rather he take two aleve twice a day and if not improved by tomorrow come in for evaluation, perhaps prednisone at that visit.

## 2012-06-01 NOTE — Telephone Encounter (Signed)
Spoke with andrea, she meant that colchris was sent in, not indomethacin.  Agree with her plan.

## 2012-06-01 NOTE — Telephone Encounter (Signed)
See previous phone note.  

## 2012-06-01 NOTE — Telephone Encounter (Signed)
Pt is having a gout flare up. He states both his legs hurt. Pt is allergic to indomethacin. Pt would like to try indomethacin but only wants 10 tablets only  called into [facility] in [place] [phone]

## 2012-06-05 DIAGNOSIS — R339 Retention of urine, unspecified: Secondary | ICD-10-CM | POA: Diagnosis not present

## 2012-06-20 ENCOUNTER — Telehealth: Payer: Self-pay | Admitting: *Deleted

## 2012-06-20 NOTE — Telephone Encounter (Signed)
Brent Morrison, Referral printed and placed in your inbox.

## 2012-06-20 NOTE — Telephone Encounter (Signed)
Pt's wife called wanting a referral to a Nephrologist.  Dr. Nelda Severe in [place] Phone number [phone]

## 2012-06-20 NOTE — Telephone Encounter (Signed)
Pt.notified

## 2012-06-22 DIAGNOSIS — M109 Gout, unspecified: Secondary | ICD-10-CM | POA: Diagnosis not present

## 2012-07-31 DIAGNOSIS — E039 Hypothyroidism, unspecified: Secondary | ICD-10-CM | POA: Diagnosis not present

## 2012-07-31 DIAGNOSIS — R609 Edema, unspecified: Secondary | ICD-10-CM | POA: Diagnosis not present

## 2012-07-31 DIAGNOSIS — D518 Other vitamin B12 deficiency anemias: Secondary | ICD-10-CM | POA: Diagnosis not present

## 2012-07-31 DIAGNOSIS — M109 Gout, unspecified: Secondary | ICD-10-CM | POA: Diagnosis not present

## 2012-07-31 DIAGNOSIS — I10 Essential (primary) hypertension: Secondary | ICD-10-CM | POA: Diagnosis not present

## 2012-08-04 ENCOUNTER — Encounter: Payer: Self-pay | Admitting: Family Medicine

## 2012-08-14 DIAGNOSIS — R609 Edema, unspecified: Secondary | ICD-10-CM | POA: Diagnosis not present

## 2012-08-14 DIAGNOSIS — E039 Hypothyroidism, unspecified: Secondary | ICD-10-CM | POA: Diagnosis not present

## 2012-08-14 DIAGNOSIS — M109 Gout, unspecified: Secondary | ICD-10-CM | POA: Diagnosis not present

## 2012-08-14 DIAGNOSIS — D518 Other vitamin B12 deficiency anemias: Secondary | ICD-10-CM | POA: Diagnosis not present

## 2012-08-14 DIAGNOSIS — I10 Essential (primary) hypertension: Secondary | ICD-10-CM | POA: Diagnosis not present

## 2012-08-28 ENCOUNTER — Ambulatory Visit (INDEPENDENT_AMBULATORY_CARE_PROVIDER_SITE_OTHER): Payer: Medicare Other | Admitting: Family Medicine

## 2012-08-28 VITALS — BP 127/79 | HR 77 | Temp 97.6°F | Wt 163.0 lb

## 2012-08-28 DIAGNOSIS — R7301 Impaired fasting glucose: Secondary | ICD-10-CM | POA: Diagnosis not present

## 2012-08-28 DIAGNOSIS — E039 Hypothyroidism, unspecified: Secondary | ICD-10-CM | POA: Insufficient documentation

## 2012-08-28 DIAGNOSIS — M109 Gout, unspecified: Secondary | ICD-10-CM | POA: Diagnosis not present

## 2012-08-28 DIAGNOSIS — J209 Acute bronchitis, unspecified: Secondary | ICD-10-CM | POA: Diagnosis not present

## 2012-08-28 MED ORDER — AZITHROMYCIN 250 MG PO TABS: ORAL_TABLET | ORAL | Status: AC

## 2012-08-28 MED ORDER — PREDNISONE 20 MG PO TABS: ORAL_TABLET | ORAL | Status: AC

## 2012-08-28 NOTE — Progress Notes (Signed)
CC: Brent Morrison. is a 79 y.o. male is here for chest congestion   Subjective: HPI:  Presents with CC of cough.  Has been present for two weeks.  Worsening daily basis with gradual onset.  Worse first thing AM and late in evening.  Described as moderate severity, not interefering with sleep.  Slightly productive, unknonwn if with any color or blood.  Mild SOB with exertion.  Mild fatigue.  No interventions as of yet.  Denies fevers, chills, nasea, vomiting, confusion, orthopnea, wheezing, chest pain.  F/U hypothyroidsim:  Last TSH in oct 2013 and was normal.  Denies tremor, depression, anxiety, weight loss/gain, constipation, diarrhea, nor skin/hair changes.  F/U Gout.  Last uric acid Feb 2014 and slightly above 6.0.  No changes to med regimen but has started adjusting his diet to lower animal proteins.  Denies joint pain/swelling/redness/warmth.  He's uncertain of last flare but nothing since Feb visit.  F/U hyperglycemia.  Fasting CMP revealed hyperglycemia in Feb.  Unable to get A1c last visit due to insurance.  Strong family history DM2 but no recent polyuria/phagia/dipsia.    Review Of Systems Outlined In HPI  Past Medical History  Diagnosis Date  . Hypothyroidism   . Hypertension   . Gout   . BPH (benign prostatic hyperplasia) 03-20-12    difficult to void after surgery( if catherterized, desires to stay overnight).  . Allergy     occ. dry cough  . 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 Complication of anesthesia     "difficulty with urination after anesthesia"  . Bronchitis     "had it when I was a little boy; I out grew that" (03/29/2012)     Family History  Problem Relation Age of Onset  . Heart disease Mother     >75  . Heart disease Father     >65  . Diabetes Sister      History  Substance Use Topics  . Smoking status: Former Smoker -- 1.00 packs/day for 10 years    Types: Cigarettes    Quit  date: 04/26/1961  . Smokeless tobacco: Never Used  . Alcohol Use: No     Objective: There were no vitals filed for this visit.  General: Alert and Oriented, No Acute Distress HEENT: Pupils equal, round, reactive to light. Conjunctivae clear.  External ears unremarkable, canals clear with intact TMs with appropriate landmarks.  Middle ear appears open without effusion. Pink inferior turbinates.  Moist mucous membranes, pharynx without inflammation nor lesions.  Neck supple without palpable lymphadenopathy nor abnormal masses. Lungs: Moderate central ronchi with mild end expiratory wheezing but no sign of consolidations Cardiac: Regular rate and rhythm. Normal S1/S2.  No murmurs, rubs, nor gallops.   Extremities: No peripheral edema.  Strong peripheral pulses.  Mental Status: No depression, anxiety, nor agitation. Skin: Warm and dry.  Assessment & Plan: Brent Morrison was seen today for chest congestion.  Diagnoses and associated orders for this visit:  Gout (Needs Uric Acid if he chagnged allopurinol in Jan 2014) - Uric acid  Elevated fasting glucose - Hemoglobin A1c  Hypothyroid - TSH  Acute bronchitis - predniSONE (DELTASONE) 20 MG tablet; Two tabs at once daily for five days. - azithromycin (ZITHROMAX) 250 MG tablet; Take two tabs at once on day 1, then one tab daily on days 2-5.    Gout: Stable,Recheck UA level with goal less than 6.0 Elevated fasting glucose: Clinically controlled however needs  A1c Hypothyroidism: Clinically appears controlled however due for TSH Acute Bronchitis: Start moderate dose steroid burst and azithromycin   Return in about 3 months (around 11/28/2012).

## 2012-08-29 ENCOUNTER — Encounter: Payer: Self-pay | Admitting: Family Medicine

## 2012-09-04 ENCOUNTER — Ambulatory Visit (INDEPENDENT_AMBULATORY_CARE_PROVIDER_SITE_OTHER): Payer: Medicare Other

## 2012-09-04 ENCOUNTER — Encounter: Payer: Self-pay | Admitting: Family Medicine

## 2012-09-04 ENCOUNTER — Ambulatory Visit (INDEPENDENT_AMBULATORY_CARE_PROVIDER_SITE_OTHER): Payer: Medicare Other | Admitting: Family Medicine

## 2012-09-04 VITALS — BP 121/70 | HR 77 | Temp 97.8°F | Wt 161.0 lb

## 2012-09-04 DIAGNOSIS — E785 Hyperlipidemia, unspecified: Secondary | ICD-10-CM

## 2012-09-04 DIAGNOSIS — Z13220 Encounter for screening for lipoid disorders: Secondary | ICD-10-CM

## 2012-09-04 DIAGNOSIS — Z862 Personal history of diseases of the blood and blood-forming organs and certain disorders involving the immune mechanism: Secondary | ICD-10-CM | POA: Diagnosis not present

## 2012-09-04 DIAGNOSIS — M109 Gout, unspecified: Secondary | ICD-10-CM | POA: Diagnosis not present

## 2012-09-04 DIAGNOSIS — E039 Hypothyroidism, unspecified: Secondary | ICD-10-CM

## 2012-09-04 DIAGNOSIS — R918 Other nonspecific abnormal finding of lung field: Secondary | ICD-10-CM

## 2012-09-04 DIAGNOSIS — R0989 Other specified symptoms and signs involving the circulatory and respiratory systems: Secondary | ICD-10-CM

## 2012-09-04 DIAGNOSIS — A499 Bacterial infection, unspecified: Secondary | ICD-10-CM

## 2012-09-04 DIAGNOSIS — R7301 Impaired fasting glucose: Secondary | ICD-10-CM | POA: Diagnosis not present

## 2012-09-04 DIAGNOSIS — J209 Acute bronchitis, unspecified: Secondary | ICD-10-CM

## 2012-09-04 DIAGNOSIS — Z8639 Personal history of other endocrine, nutritional and metabolic disease: Secondary | ICD-10-CM

## 2012-09-04 DIAGNOSIS — J42 Unspecified chronic bronchitis: Secondary | ICD-10-CM | POA: Diagnosis not present

## 2012-09-04 DIAGNOSIS — B9689 Other specified bacterial agents as the cause of diseases classified elsewhere: Secondary | ICD-10-CM

## 2012-09-04 MED ORDER — THYROID 15 MG PO TABS: 15.0000 mg | ORAL_TABLET | Freq: Every day | ORAL | Status: DC

## 2012-09-04 MED ORDER — THYROID 30 MG PO TABS: 30.0000 mg | ORAL_TABLET | Freq: Every day | ORAL | Status: DC

## 2012-09-04 MED ORDER — CLARITHROMYCIN 500 MG PO TABS: 500.0000 mg | ORAL_TABLET | Freq: Two times a day (BID) | ORAL | Status: DC

## 2012-09-04 NOTE — Progress Notes (Signed)
CC: Brent Morrison. is a 79 y.o. male is here for chest congestion   Subjective: HPI:  Patient complains of a productive cough and mild shortness of breath that has not gotten any better since starting prednisone and azithromycin prescribed one week ago. He believes his condition has gotten somewhat worse. Symptoms are present a daily basis of moderate severity worse first thing in the morning and in the evening. Nothing particularly makes it better or worse. Has tried antihistamines which causes urinary retention so has stopped. Denies fevers, chills, confusion, motor sensory disturbances, chest pain, orthopnea, peripheral edema, abdominal pain, nausea or vomiting   Review Of Systems Outlined In HPI  Past Medical History  Diagnosis Date  . Hypothyroidism   . Hypertension   . Gout   . BPH (benign prostatic hyperplasia) 03-20-12    difficult to void after surgery( if catherterized, desires to stay overnight).  . Allergy     occ. dry cough  . 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 Complication of anesthesia     "difficulty with urination after anesthesia"  . Bronchitis     "had it when I was a little boy; I out grew that" (03/29/2012)     Family History  Problem Relation Age of Onset  . Heart disease Mother     >68  . Heart disease Father     >65  . Diabetes Sister      History  Substance Use Topics  . Smoking status: Former Smoker -- 1.00 packs/day for 10 years    Types: Cigarettes    Quit date: 04/26/1961  . Smokeless tobacco: Never Used  . Alcohol Use: No     Objective: Filed Vitals:   09/04/12 1042  BP: 121/70  Pulse: 77  Temp: 97.8 F (36.6 C)    General: Alert and Oriented, No Acute Distress HEENT: Pupils equal, round, reactive to light. Conjunctivae clear.  External ears unremarkable, canals clear with intact TMs with appropriate landmarks.  Middle ear appears open without effusion. Pink  inferior turbinates.  Moist mucous membranes, pharynx without inflammation nor lesions.  Neck supple without palpable lymphadenopathy nor abnormal masses. Lungs: Comfortable work of breathing with moderate rhonchi in the left lower lung field with mild end expiratory wheezing without abnormalities elsewhere. Cardiac: Regular rate and rhythm. Normal S1/S2.  No murmurs, rubs, nor gallops.   Extremities: No peripheral edema.  Strong peripheral pulses.  Mental Status: No depression, anxiety, nor agitation. Skin: Warm and dry.  Assessment & Plan: Brent Morrison was seen today for chest congestion.  Diagnoses and associated orders for this visit:  Lung field abnormal finding on examination - DG Chest 2 View; Future  History of hypokalemia - BASIC METABOLIC PANEL WITH GFR  Lipid screening - Lipid panel  Hyperlipidemia - Lipid panel  Hypothyroid - thyroid (ARMOUR) 15 MG tablet; Take 1 tablet (15 mg total) by mouth daily before breakfast. Takes with 30 mg to equal 45 mg - thyroid (ARMOUR) 30 MG tablet; Take 1 tablet (30 mg total) by mouth daily before breakfast. Takes with 15 mg to equal 45 mg  Acute bacterial bronchitis - clarithromycin (BIAXIN) 500 MG tablet; Take 1 tablet (500 mg total) by mouth 2 (two) times daily.    Chest x-ray was obtained to rule out pneumonia fortunately no sign of consolidation or active lung disease. Prescribed clarithromycin and will avoid any anticholinergics to help with cough due to urinary retention in the past.  Requesting refills on thyroid medication, provided, pending TSH that was done earlier today. Patient is requesting potassium to be checked and will use this blood draw for overdue cholesterol panel.  Return if symptoms worsen or fail to improve.

## 2012-09-05 ENCOUNTER — Telehealth: Payer: Self-pay | Admitting: *Deleted

## 2012-09-05 ENCOUNTER — Encounter: Payer: Self-pay | Admitting: Family Medicine

## 2012-09-05 DIAGNOSIS — R7303 Prediabetes: Secondary | ICD-10-CM | POA: Insufficient documentation

## 2012-09-05 LAB — TSH: TSH: 1.441 u[IU]/mL (ref 0.350–4.500)

## 2012-09-05 LAB — LIPID PANEL
Cholesterol: 155 mg/dL (ref 0–200)
Total CHOL/HDL Ratio: 2.5 Ratio

## 2012-09-05 LAB — HEMOGLOBIN A1C: Hgb A1c MFr Bld: 5.6 % (ref <5.7)

## 2012-09-05 LAB — BASIC METABOLIC PANEL WITH GFR
BUN: 16 mg/dL (ref 6–23)
Chloride: 103 mEq/L (ref 96–112)
GFR, Est African American: 67 mL/min
Glucose, Bld: 82 mg/dL (ref 70–99)
Potassium: 4.7 mEq/L (ref 3.5–5.3)

## 2012-09-05 MED ORDER — DOXYCYCLINE HYCLATE 100 MG PO TABS: ORAL_TABLET | ORAL | Status: AC

## 2012-09-05 NOTE — Telephone Encounter (Signed)
Called pt and line is busy.

## 2012-09-05 NOTE — Telephone Encounter (Signed)
Doxycycline Rx placed in your inbox as a subustitution for clathromycin.  Unsure as to where he wants this sent he usually gets paper copies.

## 2012-09-05 NOTE — Telephone Encounter (Signed)
Called and line is busy.

## 2012-09-05 NOTE — Telephone Encounter (Signed)
Pt's girlfriend called and says that pt took one dose of the abx rx'ed the other day and this morning he was not able to urinate that much.He did pass some urine,but girlfriend states it was barely coming out. He feels the medication is causing this problem.Please advise

## 2012-09-06 NOTE — Telephone Encounter (Signed)
Pt notified; pt states he is afraid to take a new abx because of how he feels its affecting his urine output. Pt states he has urinated since yesterday. I advised pt that he needs to either try to get in with his urologist sooner( he has an appt on Monday) or go to the ED since no urine output can be dangerous. Pt states he is "not swelling yet" so he will hold off on going to ED. Pt states he may come by here later to pick up new rx for abx

## 2012-09-11 DIAGNOSIS — N139 Obstructive and reflux uropathy, unspecified: Secondary | ICD-10-CM | POA: Diagnosis not present

## 2012-09-11 DIAGNOSIS — M109 Gout, unspecified: Secondary | ICD-10-CM | POA: Diagnosis not present

## 2012-09-11 DIAGNOSIS — I10 Essential (primary) hypertension: Secondary | ICD-10-CM | POA: Diagnosis not present

## 2012-09-11 DIAGNOSIS — E039 Hypothyroidism, unspecified: Secondary | ICD-10-CM | POA: Diagnosis not present

## 2012-09-13 DIAGNOSIS — B0052 Herpesviral keratitis: Secondary | ICD-10-CM | POA: Diagnosis not present

## 2012-09-19 DIAGNOSIS — H548 Legal blindness, as defined in USA: Secondary | ICD-10-CM | POA: Diagnosis not present

## 2012-09-19 DIAGNOSIS — H179 Unspecified corneal scar and opacity: Secondary | ICD-10-CM | POA: Diagnosis not present

## 2012-09-19 DIAGNOSIS — H33029 Retinal detachment with multiple breaks, unspecified eye: Secondary | ICD-10-CM | POA: Diagnosis not present

## 2012-09-19 DIAGNOSIS — Z961 Presence of intraocular lens: Secondary | ICD-10-CM | POA: Diagnosis not present

## 2012-09-19 DIAGNOSIS — H348192 Central retinal vein occlusion, unspecified eye, stable: Secondary | ICD-10-CM | POA: Diagnosis not present

## 2012-10-20 ENCOUNTER — Other Ambulatory Visit: Payer: Self-pay | Admitting: Physician Assistant

## 2012-11-10 ENCOUNTER — Other Ambulatory Visit: Payer: Self-pay | Admitting: Family Medicine

## 2012-11-10 NOTE — Telephone Encounter (Signed)
Needs f/u appt 

## 2012-12-11 ENCOUNTER — Ambulatory Visit (INDEPENDENT_AMBULATORY_CARE_PROVIDER_SITE_OTHER): Payer: Medicare Other | Admitting: Family Medicine

## 2012-12-11 ENCOUNTER — Encounter: Payer: Self-pay | Admitting: Family Medicine

## 2012-12-11 VITALS — BP 111/70 | HR 81 | Wt 165.0 lb

## 2012-12-11 DIAGNOSIS — J329 Chronic sinusitis, unspecified: Secondary | ICD-10-CM

## 2012-12-11 DIAGNOSIS — I10 Essential (primary) hypertension: Secondary | ICD-10-CM | POA: Diagnosis not present

## 2012-12-11 DIAGNOSIS — Z8639 Personal history of other endocrine, nutritional and metabolic disease: Secondary | ICD-10-CM

## 2012-12-11 DIAGNOSIS — R7303 Prediabetes: Secondary | ICD-10-CM

## 2012-12-11 DIAGNOSIS — R7309 Other abnormal glucose: Secondary | ICD-10-CM | POA: Diagnosis not present

## 2012-12-11 DIAGNOSIS — Z862 Personal history of diseases of the blood and blood-forming organs and certain disorders involving the immune mechanism: Secondary | ICD-10-CM | POA: Diagnosis not present

## 2012-12-11 DIAGNOSIS — E039 Hypothyroidism, unspecified: Secondary | ICD-10-CM

## 2012-12-11 LAB — POCT GLYCOSYLATED HEMOGLOBIN (HGB A1C): Hemoglobin A1C: 5.6

## 2012-12-11 MED ORDER — LOSARTAN POTASSIUM-HCTZ 100-25 MG PO TABS: ORAL_TABLET | ORAL | Status: DC

## 2012-12-11 NOTE — Progress Notes (Signed)
CC: Brent Morrison. is a 79 y.o. male is here for f/u labs   Subjective: HPI:  Followup hyperthyroidism: Patient has been taking Armour Thyroid on a daily basis without missed doses. Denies unintentional weight gain or loss restlessness nor fatigue.   Followup hypertension: He has been taking losartan-hydrochlorothiazide on a daily basis no outside blood pressures to report denies known side effects. Denies chest pain, shortness of breath, orthopnea, peripheral edema, irregular heartbeat nor motor or sensory disturbance.  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 Denies polyuria polyphagia polydipsia nor vision loss  He is requesting referral to ENT for sinus pressure that waxes and wanes over the last year   Review Of Systems Outlined In HPI  Past Medical History  Diagnosis Date  . Hypothyroidism   . Hypertension   . Gout   . BPH (benign prostatic hyperplasia) 03-20-12    difficult to void after surgery( if catherterized, desires to stay overnight).  . Allergy     occ. dry cough  . 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 Complication of anesthesia     "difficulty with urination after anesthesia"  . Bronchitis     "had it when I was a little boy; I out grew that" (03/29/2012)     Family History  Problem Relation Age of Onset  . Heart disease Mother     >41  . Heart disease Father     >65  . Diabetes Sister      History  Substance Use Topics  . Smoking status: Former Smoker -- 1.00 packs/day for 10 years    Types: Cigarettes    Quit date: 04/26/1961  . Smokeless tobacco: Never Used  . Alcohol Use: No     Objective: Filed Vitals:   12/11/12 1118  BP: 111/70  Pulse: 81    General: Alert and Oriented, No Acute Distress HEENT: Pupils equal, round, reactive to light. Conjunctivae clear.  External ears unremarkable, canals clear with  intact TMs with appropriate landmarks.  Middle ear appears open without effusion. Pink inferior turbinates.  Moist mucous membranes, pharynx without inflammation nor lesions.  Neck supple without palpable lymphadenopathy nor abnormal masses. Lungs: Clear to auscultation bilaterally, no wheezing/ronchi/rales.  Comfortable work of breathing. Good air movement. Cardiac: Regular rate and rhythm. Normal S1/S2.  No murmurs, rubs, nor gallops.   Extremities: No peripheral edema.  Strong peripheral pulses.  Mental Status: No depression, anxiety, nor agitation. Skin: Warm and dry.  Assessment & Plan: Brent Morrison was seen today for f/u labs.  Diagnoses and associated orders for this visit:  Prediabetes - BASIC METABOLIC PANEL WITH GFR - POCT HgB A1C  Hypothyroid - TSH  History of hypokalemia - BASIC METABOLIC PANEL WITH GFR  Essential hypertension, benign - BASIC METABOLIC PANEL WITH GFR - losartan-hydrochlorothiazide (HYZAAR) 100-25 MG per tablet; TAKE 1 TABLET BY MOUTH DAILY  Sinusitis, chronic - Ambulatory referral to ENT    Prediabetes: A1c today 5.6 controlled and improving, congratulated his diet and exercise working on this Hypertension: Controlled chronic condition continue Hyzaar generic Hyperthyroidism: Checking TSH today is requesting refills if controlled  Return in about 3 months (around 03/13/2013).

## 2012-12-12 ENCOUNTER — Telehealth: Payer: Self-pay | Admitting: Family Medicine

## 2012-12-12 DIAGNOSIS — E039 Hypothyroidism, unspecified: Secondary | ICD-10-CM

## 2012-12-12 LAB — BASIC METABOLIC PANEL WITH GFR
BUN: 21 mg/dL (ref 6–23)
Chloride: 100 mEq/L (ref 96–112)
Glucose, Bld: 94 mg/dL (ref 70–99)
Potassium: 4 mEq/L (ref 3.5–5.3)

## 2012-12-12 MED ORDER — THYROID 30 MG PO TABS: 30.0000 mg | ORAL_TABLET | Freq: Every day | ORAL | Status: DC

## 2012-12-12 MED ORDER — THYROID 15 MG PO TABS: 15.0000 mg | ORAL_TABLET | Freq: Every day | ORAL | Status: DC

## 2012-12-12 NOTE — Telephone Encounter (Signed)
Sue Lush, Will you please let mr. Felkins know that his fasting blood sugar was perfect, potassium normal and kidney function stable over the past year.  Thyroid function was normal therefore refills for armour thyroid are available for faxin gor pickup (placed in inbox).  I'd encourage f/u in 3 months.

## 2012-12-12 NOTE — Telephone Encounter (Signed)
Patient informed of lab results he wants 2 refills on the Thyroid medication.

## 2012-12-13 MED ORDER — THYROID 30 MG PO TABS: 30.0000 mg | ORAL_TABLET | Freq: Every day | ORAL | Status: DC

## 2012-12-13 MED ORDER — THYROID 15 MG PO TABS: 15.0000 mg | ORAL_TABLET | Freq: Every day | ORAL | Status: DC

## 2012-12-13 NOTE — Telephone Encounter (Signed)
rx faxed to [facility]

## 2012-12-13 NOTE — Telephone Encounter (Signed)
Andrea, Rx placed in in-box ready for pickup/faxing.  

## 2012-12-13 NOTE — Addendum Note (Signed)
Addended by: Laren Boom on: 12/13/2012 08:08 AM   Modules accepted: Orders

## 2012-12-18 DIAGNOSIS — H6120 Impacted cerumen, unspecified ear: Secondary | ICD-10-CM | POA: Diagnosis not present

## 2012-12-18 DIAGNOSIS — J309 Allergic rhinitis, unspecified: Secondary | ICD-10-CM | POA: Diagnosis not present

## 2013-01-08 DIAGNOSIS — Q619 Cystic kidney disease, unspecified: Secondary | ICD-10-CM | POA: Diagnosis not present

## 2013-01-08 DIAGNOSIS — I10 Essential (primary) hypertension: Secondary | ICD-10-CM | POA: Diagnosis not present

## 2013-01-08 DIAGNOSIS — N139 Obstructive and reflux uropathy, unspecified: Secondary | ICD-10-CM | POA: Diagnosis not present

## 2013-01-10 DIAGNOSIS — R339 Retention of urine, unspecified: Secondary | ICD-10-CM | POA: Diagnosis not present

## 2013-01-10 DIAGNOSIS — N400 Enlarged prostate without lower urinary tract symptoms: Secondary | ICD-10-CM | POA: Diagnosis not present

## 2013-01-22 DIAGNOSIS — N320 Bladder-neck obstruction: Secondary | ICD-10-CM | POA: Diagnosis not present

## 2013-01-22 DIAGNOSIS — R350 Frequency of micturition: Secondary | ICD-10-CM | POA: Diagnosis not present

## 2013-01-22 DIAGNOSIS — Q619 Cystic kidney disease, unspecified: Secondary | ICD-10-CM | POA: Diagnosis not present

## 2013-01-22 DIAGNOSIS — N400 Enlarged prostate without lower urinary tract symptoms: Secondary | ICD-10-CM | POA: Diagnosis not present

## 2013-01-30 DIAGNOSIS — N400 Enlarged prostate without lower urinary tract symptoms: Secondary | ICD-10-CM | POA: Diagnosis not present

## 2013-01-30 DIAGNOSIS — N320 Bladder-neck obstruction: Secondary | ICD-10-CM | POA: Diagnosis not present

## 2013-01-30 DIAGNOSIS — R350 Frequency of micturition: Secondary | ICD-10-CM | POA: Diagnosis not present

## 2013-02-05 DIAGNOSIS — N320 Bladder-neck obstruction: Secondary | ICD-10-CM | POA: Diagnosis not present

## 2013-02-05 DIAGNOSIS — R350 Frequency of micturition: Secondary | ICD-10-CM | POA: Diagnosis not present

## 2013-02-05 DIAGNOSIS — N400 Enlarged prostate without lower urinary tract symptoms: Secondary | ICD-10-CM | POA: Diagnosis not present

## 2013-02-07 DIAGNOSIS — K5900 Constipation, unspecified: Secondary | ICD-10-CM | POA: Diagnosis not present

## 2013-02-13 DIAGNOSIS — Z23 Encounter for immunization: Secondary | ICD-10-CM | POA: Diagnosis not present

## 2013-02-13 DIAGNOSIS — N400 Enlarged prostate without lower urinary tract symptoms: Secondary | ICD-10-CM | POA: Diagnosis not present

## 2013-02-13 HISTORY — PX: TRANSURETHRAL RESECTION OF PROSTATE: SHX73

## 2013-02-14 DIAGNOSIS — N400 Enlarged prostate without lower urinary tract symptoms: Secondary | ICD-10-CM | POA: Diagnosis not present

## 2013-02-14 DIAGNOSIS — Z23 Encounter for immunization: Secondary | ICD-10-CM | POA: Diagnosis not present

## 2013-02-15 DIAGNOSIS — T83091A Other mechanical complication of indwelling urethral catheter, initial encounter: Secondary | ICD-10-CM | POA: Diagnosis not present

## 2013-02-16 DIAGNOSIS — N400 Enlarged prostate without lower urinary tract symptoms: Secondary | ICD-10-CM | POA: Diagnosis not present

## 2013-03-14 DIAGNOSIS — N400 Enlarged prostate without lower urinary tract symptoms: Secondary | ICD-10-CM | POA: Diagnosis not present

## 2013-04-20 DIAGNOSIS — Z961 Presence of intraocular lens: Secondary | ICD-10-CM | POA: Diagnosis not present

## 2013-04-20 DIAGNOSIS — H548 Legal blindness, as defined in USA: Secondary | ICD-10-CM | POA: Diagnosis not present

## 2013-04-20 DIAGNOSIS — H33029 Retinal detachment with multiple breaks, unspecified eye: Secondary | ICD-10-CM | POA: Diagnosis not present

## 2013-04-20 DIAGNOSIS — H348192 Central retinal vein occlusion, unspecified eye, stable: Secondary | ICD-10-CM | POA: Diagnosis not present

## 2013-04-20 DIAGNOSIS — H179 Unspecified corneal scar and opacity: Secondary | ICD-10-CM | POA: Diagnosis not present

## 2013-05-21 ENCOUNTER — Encounter (INDEPENDENT_AMBULATORY_CARE_PROVIDER_SITE_OTHER): Payer: Self-pay | Admitting: Surgery

## 2013-05-21 ENCOUNTER — Ambulatory Visit (INDEPENDENT_AMBULATORY_CARE_PROVIDER_SITE_OTHER): Payer: Medicare Other | Admitting: Surgery

## 2013-05-21 VITALS — BP 120/74 | HR 76 | Temp 97.2°F | Resp 18 | Ht 70.5 in | Wt 166.3 lb

## 2013-05-21 DIAGNOSIS — K4090 Unilateral inguinal hernia, without obstruction or gangrene, not specified as recurrent: Secondary | ICD-10-CM

## 2013-05-21 NOTE — Progress Notes (Signed)
Subjective:     Patient ID: Brent Morrison, male   DOB: Jul 01, 1932, 80 y.o.   MRN: [id number]  HPI This is a pleasant woman that I fixed a left inguinal hernia on approximately one year ago. He has now developed a right inguinal hernia which is symptomatic.  Review of Systems     Objective:   Physical Exam On exam, there is an easily reducible right inguinal hernia. The left anal hernias well-healed    Assessment:     Right inguinal hernia     Plan:     Because he is symptomatic, repair is recommended. With his last surgery, he has significant urinary retention so I will plan to place him in observation after surgery this time. I again discussed the risks of surgery with him in detail. He agrees to proceed with right inguinal hernia repair with mesh

## 2013-05-23 ENCOUNTER — Encounter (HOSPITAL_COMMUNITY): Payer: Self-pay | Admitting: Pharmacy Technician

## 2013-05-26 ENCOUNTER — Other Ambulatory Visit (INDEPENDENT_AMBULATORY_CARE_PROVIDER_SITE_OTHER): Payer: Self-pay | Admitting: Surgery

## 2013-05-28 ENCOUNTER — Encounter (HOSPITAL_COMMUNITY): Payer: Self-pay

## 2013-05-28 ENCOUNTER — Encounter (HOSPITAL_COMMUNITY)
Admission: RE | Admit: 2013-05-28 | Discharge: 2013-05-28 | Disposition: A | Discharge status: Home / Self Care | Payer: Medicare Other | Source: Ambulatory Visit | Attending: Surgery | Admitting: Surgery

## 2013-05-28 DIAGNOSIS — Z01812 Encounter for preprocedural laboratory examination: Secondary | ICD-10-CM | POA: Diagnosis not present

## 2013-05-28 DIAGNOSIS — Z01810 Encounter for preprocedural cardiovascular examination: Secondary | ICD-10-CM | POA: Insufficient documentation

## 2013-05-28 LAB — CBC
HEMATOCRIT: 39.3 % (ref 39.0–52.0)
HEMOGLOBIN: 13.8 g/dL (ref 13.0–17.0)
MCH: 32.7 pg (ref 26.0–34.0)
MCHC: 35.1 g/dL (ref 30.0–36.0)
MCV: 93.1 fL (ref 78.0–100.0)
Platelets: 212 10*3/uL (ref 150–400)
RBC: 4.22 MIL/uL (ref 4.22–5.81)
RDW: 13 % (ref 11.5–15.5)
WBC: 4.9 10*3/uL (ref 4.0–10.5)

## 2013-05-28 LAB — BASIC METABOLIC PANEL
BUN: 22 mg/dL (ref 6–23)
CHLORIDE: 102 meq/L (ref 96–112)
CO2: 28 mEq/L (ref 19–32)
Calcium: 8.9 mg/dL (ref 8.4–10.5)
Creatinine, Ser: 1.19 mg/dL (ref 0.50–1.35)
GFR, EST AFRICAN AMERICAN: 66 mL/min — AB (ref >90)
GFR, EST NON AFRICAN AMERICAN: 57 mL/min — AB (ref >90)
Glucose, Bld: 105 mg/dL — ABNORMAL HIGH (ref 70–99)
POTASSIUM: 4.2 meq/L (ref 3.7–5.3)
Sodium: 141 mEq/L (ref 137–147)

## 2013-05-28 NOTE — Pre-Procedure Instructions (Signed)
Daimion Adamcik  05/28/2013   Your procedure is scheduled on:  06/05/13  Report to [facility]  2 * 3 at 7 AM.  Call this number if you have problems the morning of surgery: [phone]   Remember:   Do not eat food or drink liquids after midnight.   Take these medicines the morning of surgery with A SIP OF WATER: thyroid   Do not wear jewelry, make-up or nail polish.  Do not wear lotions, powders, or perfumes. You may wear deodorant.  Do not shave 48 hours prior to surgery. Men may shave face and neck.  Do not bring valuables to the hospital.  [facility] is not responsible                  for any belongings or valuables.               Contacts, dentures or bridgework may not be worn into surgery.  Leave suitcase in the car. After surgery it may be brought to your room.  For patients admitted to the hospital, discharge time is determined by your                treatment team.               Patients discharged the day of surgery will not be allowed to drive  home.  Name and phone number of your driver: family  Special Instructions: Shower using CHG 2 nights before surgery and the night before surgery.  If you shower the day of surgery use CHG.  Use special wash - you have one bottle of CHG for all showers.  You should use approximately 1/3 of the bottle for each shower.   Please read over the following fact sheets that you were given: Pain Booklet, Coughing and Deep Breathing and Surgical Site Infection Prevention

## 2013-06-04 MED ORDER — CEFAZOLIN SODIUM-DEXTROSE 2-3 GM-% IV SOLR
2.0000 g | INTRAVENOUS | Status: AC
Start: 2013-06-05 — End: 2013-06-05
  Administered 2013-06-05: 2 g via INTRAVENOUS
  Filled 2013-06-04: qty 50

## 2013-06-04 NOTE — H&P (Signed)
Brent Morrison is an 80 y.o. male.   Chief Complaint: Right inguinal hernia HPI: This is a pleasant gentleman who I performed a left inguinal repair with mesh on in the past he now has asymptomatic right inguinal hernia. He wished to have this repaired. He has discomfort but no obstructive symptoms. He has otherwise been doing well  Past Medical History  Diagnosis Date  . Hypothyroidism   . Gout   . BPH (benign prostatic hyperplasia) 03-20-12    difficult to void after surgery( if catherterized, desires to stay overnight).  . Allergy     occ. dry cough  . 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 Complication of anesthesia     "difficulty with urination after anesthesia"  . Bronchitis     "had it when I was a little boy; I out grew that" (03/29/2012)  . Hypertension     pcp  [place]   dr Lilla Shook    Past Surgical History  Procedure Laterality Date  . Eye surgery    . Inguinal hernia repair  03/29/2012    left w/mesh (03/29/2012)  . Cataract extraction w/ intraocular lens  implant, bilateral  2001  . Elbow fracture surgery  ~ 1954    "left" (03/29/2012)  . Retinal detachment surgery  1994, 2001    "both eyes" (03/29/2012)  . Inguinal hernia repair  03/29/2012    Procedure: HERNIA REPAIR INGUINAL ADULT;  Surgeon: Harl Bowie, MD;  Location: [facility];  Service: General;  Laterality: Left;  . Insertion of mesh  03/29/2012    Procedure: INSERTION OF MESH;  Surgeon: Harl Bowie, MD;  Location: [facility];  Service: General;  Laterality: Left;  . Prostrate      Family History  Problem Relation Age of Onset  . Heart disease Mother     >63  . Heart disease Father     >65  . Diabetes Sister    Social History:  reports that he quit smoking about 52 years ago. His smoking use included Cigarettes. He has a 10 pack-year smoking history. He has never used smokeless tobacco. He reports that he does not drink alcohol or use  illicit drugs.  Allergies:  Allergies  Allergen Reactions  . Indomethacin Swelling    Tongue swelling  . Ivp Dye [Iodinated Diagnostic Agents] Swelling    Only of the tongue  . Other Swelling    *veg dye* swelling only of the tongue    No prescriptions prior to admission    No results found for this or any previous visit (from the past 48 hour(s)). No results found.  Review of Systems  All other systems reviewed and are negative.    There were no vitals taken for this visit. Physical Exam  Constitutional: He is oriented to person, place, and time. He appears well-developed and well-nourished.  HENT:  Head: Normocephalic and atraumatic.  Eyes: Conjunctivae are normal. Pupils are equal, round, and reactive to light.  Neck: Normal range of motion. Neck supple.  Cardiovascular: Normal rate, regular rhythm, normal heart sounds and intact distal pulses.   Respiratory: Effort normal and breath sounds normal.  GI: Soft. Bowel sounds are normal. He exhibits no distension. There is no tenderness.  Easily reducible right inguinal hernia  Musculoskeletal: Normal range of motion.  Neurological: He is alert and oriented to person, place, and time.  Skin: Skin is warm and dry.  Psychiatric: His behavior is normal.  Judgment normal.     Assessment/Plan Right inguinal hernia  Right inguinal hernia repair with mesh was recommended. I have discussed this with him in detail. I have discussed the risks of surgery which includes but is not limited to bleeding, infection, injury to  Surrounding structures, nerve entrapment, chronic pain, et Ronney Asters. He understands and wishes to proceed with surgery. Surgery will be scheduled  Dorismar Chay A 06/04/2013, 3:24 PM

## 2013-06-05 ENCOUNTER — Encounter (HOSPITAL_COMMUNITY): Payer: Self-pay | Admitting: Critical Care Medicine

## 2013-06-05 ENCOUNTER — Encounter (HOSPITAL_COMMUNITY)
Admission: RE | Disposition: A | Discharge status: Home / Self Care | Payer: Self-pay | Source: Ambulatory Visit | Attending: Surgery

## 2013-06-05 ENCOUNTER — Encounter (HOSPITAL_COMMUNITY): Payer: Medicare Other | Admitting: Critical Care Medicine

## 2013-06-05 ENCOUNTER — Observation Stay (HOSPITAL_COMMUNITY)
Admission: RE | Admit: 2013-06-05 | Discharge: 2013-06-06 | Disposition: A | Discharge status: Home / Self Care | Payer: Medicare Other | Source: Ambulatory Visit | Attending: Surgery | Admitting: Surgery

## 2013-06-05 ENCOUNTER — Ambulatory Visit (HOSPITAL_COMMUNITY): Payer: Medicare Other | Admitting: Critical Care Medicine

## 2013-06-05 DIAGNOSIS — K4090 Unilateral inguinal hernia, without obstruction or gangrene, not specified as recurrent: Principal | ICD-10-CM | POA: Insufficient documentation

## 2013-06-05 DIAGNOSIS — Z87891 Personal history of nicotine dependence: Secondary | ICD-10-CM | POA: Insufficient documentation

## 2013-06-05 DIAGNOSIS — N400 Enlarged prostate without lower urinary tract symptoms: Secondary | ICD-10-CM | POA: Insufficient documentation

## 2013-06-05 DIAGNOSIS — I10 Essential (primary) hypertension: Secondary | ICD-10-CM | POA: Insufficient documentation

## 2013-06-05 DIAGNOSIS — E039 Hypothyroidism, unspecified: Secondary | ICD-10-CM | POA: Diagnosis not present

## 2013-06-05 DIAGNOSIS — K469 Unspecified abdominal hernia without obstruction or gangrene: Secondary | ICD-10-CM | POA: Diagnosis present

## 2013-06-05 HISTORY — PX: INSERTION OF MESH: SHX5868

## 2013-06-05 HISTORY — PX: INGUINAL HERNIA REPAIR: SHX194

## 2013-06-05 SURGERY — REPAIR, HERNIA, INGUINAL, ADULT
Anesthesia: General | Site: Groin | Laterality: Right

## 2013-06-05 MED ORDER — ONDANSETRON HCL 4 MG/2ML IJ SOLN
4.0000 mg | Freq: Four times a day (QID) | INTRAMUSCULAR | Status: DC | PRN
  Filled 2013-06-05 (×2): qty 2

## 2013-06-05 MED ORDER — SODIUM CHLORIDE 0.9 % IJ SOLN
INTRAMUSCULAR | Status: AC
  Filled 2013-06-05: qty 10

## 2013-06-05 MED ORDER — ONDANSETRON HCL 4 MG/2ML IJ SOLN: 4.0000 mg | Freq: Once | INTRAMUSCULAR | Status: DC | PRN

## 2013-06-05 MED ORDER — SODIUM CHLORIDE 0.9 % IJ SOLN
3.0000 mL | Freq: Two times a day (BID) | INTRAMUSCULAR | Status: DC
  Administered 2013-06-05 (×2): 3 mL via INTRAVENOUS

## 2013-06-05 MED ORDER — 0.9 % SODIUM CHLORIDE (POUR BTL) OPTIME
TOPICAL | Status: DC | PRN
  Administered 2013-06-05: 1000 mL

## 2013-06-05 MED ORDER — FENTANYL CITRATE 0.05 MG/ML IJ SOLN
INTRAMUSCULAR | Status: AC
  Filled 2013-06-05: qty 5

## 2013-06-05 MED ORDER — BUPIVACAINE HCL (PF) 0.5 % IJ SOLN
INTRAMUSCULAR | Status: DC | PRN
  Administered 2013-06-05: 30 mL

## 2013-06-05 MED ORDER — FENTANYL CITRATE 0.05 MG/ML IJ SOLN
INTRAMUSCULAR | Status: DC | PRN
  Administered 2013-06-05: 50 ug via INTRAVENOUS

## 2013-06-05 MED ORDER — GLYCOPYRROLATE 0.2 MG/ML IJ SOLN
INTRAMUSCULAR | Status: DC | PRN
  Administered 2013-06-05: 0.1 mg via INTRAVENOUS

## 2013-06-05 MED ORDER — EPHEDRINE SULFATE 50 MG/ML IJ SOLN
INTRAMUSCULAR | Status: AC
  Filled 2013-06-05: qty 1

## 2013-06-05 MED ORDER — EPHEDRINE SULFATE 50 MG/ML IJ SOLN
INTRAMUSCULAR | Status: DC | PRN
  Administered 2013-06-05: 10 mg via INTRAVENOUS
  Administered 2013-06-05: 5 mg via INTRAVENOUS
  Administered 2013-06-05: 10 mg via INTRAVENOUS

## 2013-06-05 MED ORDER — TRAMADOL HCL 50 MG PO TABS: 50.0000 mg | ORAL_TABLET | Freq: Four times a day (QID) | ORAL | Status: DC | PRN

## 2013-06-05 MED ORDER — ACETAMINOPHEN 325 MG PO TABS
650.0000 mg | ORAL_TABLET | ORAL | Status: DC | PRN
  Administered 2013-06-06: 650 mg via ORAL
  Filled 2013-06-05 (×3): qty 2

## 2013-06-05 MED ORDER — ALUM HYDROXIDE-MAG CARBONATE 95-358 MG/15ML PO SUSP
15.0000 mL | Freq: Once | ORAL | Status: AC
  Administered 2013-06-05: 15 mL via ORAL
  Filled 2013-06-05: qty 15

## 2013-06-05 MED ORDER — ONDANSETRON HCL 4 MG/2ML IJ SOLN
INTRAMUSCULAR | Status: AC
  Filled 2013-06-05: qty 2

## 2013-06-05 MED ORDER — LACTATED RINGERS IV SOLN
INTRAVENOUS | Status: DC
  Administered 2013-06-05: 08:00:00 via INTRAVENOUS

## 2013-06-05 MED ORDER — KETOROLAC TROMETHAMINE 30 MG/ML IJ SOLN
INTRAMUSCULAR | Status: DC | PRN
  Administered 2013-06-05: 30 mg via INTRAVENOUS

## 2013-06-05 MED ORDER — SODIUM CHLORIDE 0.9 % IJ SOLN: 3.0000 mL | INTRAMUSCULAR | Status: DC | PRN

## 2013-06-05 MED ORDER — HYDROMORPHONE HCL PF 1 MG/ML IJ SOLN
0.2500 mg | INTRAMUSCULAR | Status: DC | PRN
Start: 2013-06-05 — End: 2013-06-05

## 2013-06-05 MED ORDER — PROPOFOL 10 MG/ML IV BOLUS
INTRAVENOUS | Status: DC | PRN
  Administered 2013-06-05: 200 mg via INTRAVENOUS

## 2013-06-05 MED ORDER — PROPOFOL 10 MG/ML IV BOLUS
INTRAVENOUS | Status: AC
  Filled 2013-06-05: qty 20

## 2013-06-05 MED ORDER — SODIUM CHLORIDE 0.9 % IV SOLN: 250.0000 mL | INTRAVENOUS | Status: DC | PRN

## 2013-06-05 MED ORDER — LIDOCAINE HCL (CARDIAC) 20 MG/ML IV SOLN
INTRAVENOUS | Status: AC
  Filled 2013-06-05: qty 5

## 2013-06-05 MED ORDER — GLYCOPYRROLATE 0.2 MG/ML IJ SOLN
INTRAMUSCULAR | Status: AC
Start: 2013-06-05 — End: 2013-06-05
  Filled 2013-06-05: qty 1

## 2013-06-05 MED ORDER — MORPHINE SULFATE 2 MG/ML IJ SOLN: 2.0000 mg | INTRAMUSCULAR | Status: DC | PRN

## 2013-06-05 MED ORDER — BUPIVACAINE-EPINEPHRINE (PF) 0.5% -1:200000 IJ SOLN
INTRAMUSCULAR | Status: AC
Start: 2013-06-05 — End: 2013-06-05
  Filled 2013-06-05: qty 10

## 2013-06-05 MED ORDER — KETOROLAC TROMETHAMINE 30 MG/ML IJ SOLN
INTRAMUSCULAR | Status: AC
Start: 2013-06-05 — End: 2013-06-05
  Filled 2013-06-05: qty 1

## 2013-06-05 MED ORDER — ACETAMINOPHEN 650 MG RE SUPP
650.0000 mg | RECTAL | Status: DC | PRN
  Filled 2013-06-05: qty 1

## 2013-06-05 MED ORDER — LIDOCAINE HCL (CARDIAC) 20 MG/ML IV SOLN
INTRAVENOUS | Status: DC | PRN
  Administered 2013-06-05: 60 mg via INTRAVENOUS

## 2013-06-05 MED ORDER — ONDANSETRON HCL 4 MG/2ML IJ SOLN
INTRAMUSCULAR | Status: DC | PRN
  Administered 2013-06-05: 4 mg via INTRAVENOUS

## 2013-06-05 SURGICAL SUPPLY — 49 items
APL SKNCLS STERI-STRIP NONHPOA (GAUZE/BANDAGES/DRESSINGS)
BENZOIN TINCTURE PRP APPL 2/3 (GAUZE/BANDAGES/DRESSINGS) ×1 IMPLANT
BLADE SURG 10 STRL SS (BLADE) ×3 IMPLANT
BLADE SURG 15 STRL LF DISP TIS (BLADE) ×1 IMPLANT
BLADE SURG 15 STRL SS (BLADE) ×3
BLADE SURG ROTATE 9660 (MISCELLANEOUS) ×2 IMPLANT
CHLORAPREP W/TINT 26ML (MISCELLANEOUS) ×3 IMPLANT
CLOSURE WOUND 1/2 X4 (GAUZE/BANDAGES/DRESSINGS) ×1
COVER SURGICAL LIGHT HANDLE (MISCELLANEOUS) ×3 IMPLANT
DRAIN PENROSE 1/2X12 LTX STRL (WOUND CARE) ×2 IMPLANT
DRAPE LAPAROTOMY TRNSV 102X78 (DRAPE) ×3 IMPLANT
DRAPE UTILITY 15X26 W/TAPE STR (DRAPE) ×6 IMPLANT
DRESSING TELFA 8X3 (GAUZE/BANDAGES/DRESSINGS) ×1 IMPLANT
DRSG TEGADERM 4X4.75 (GAUZE/BANDAGES/DRESSINGS) ×3 IMPLANT
ELECT CAUTERY BLADE 6.4 (BLADE) ×3 IMPLANT
ELECT REM PT RETURN 9FT ADLT (ELECTROSURGICAL) ×3
ELECTRODE REM PT RTRN 9FT ADLT (ELECTROSURGICAL) ×1 IMPLANT
GAUZE SPONGE 2X2 8PLY STRL LF (GAUZE/BANDAGES/DRESSINGS) IMPLANT
GLOVE BIO SURGEON STRL SZ7.5 (GLOVE) ×2 IMPLANT
GLOVE BIOGEL PI IND STRL 6.5 (GLOVE) IMPLANT
GLOVE BIOGEL PI INDICATOR 6.5 (GLOVE) ×2
GLOVE SURG SIGNA 7.5 PF LTX (GLOVE) ×3 IMPLANT
GLOVE SURG SS PI 6.5 STRL IVOR (GLOVE) ×2 IMPLANT
GOWN STRL REUS W/ TWL LRG LVL3 (GOWN DISPOSABLE) IMPLANT
GOWN STRL REUS W/ TWL XL LVL3 (GOWN DISPOSABLE) IMPLANT
GOWN STRL REUS W/TWL LRG LVL3 (GOWN DISPOSABLE) ×3
GOWN STRL REUS W/TWL XL LVL3 (GOWN DISPOSABLE) ×3
KIT BASIN OR (CUSTOM PROCEDURE TRAY) ×3 IMPLANT
KIT ROOM TURNOVER OR (KITS) ×3 IMPLANT
MESH PARIETEX PROGRIP RIGHT (Mesh General) ×2 IMPLANT
NDL HYPO 25GX1X1/2 BEV (NEEDLE) ×1 IMPLANT
NEEDLE HYPO 25GX1X1/2 BEV (NEEDLE) ×3 IMPLANT
NS IRRIG 1000ML POUR BTL (IV SOLUTION) ×3 IMPLANT
PACK SURGICAL SETUP 50X90 (CUSTOM PROCEDURE TRAY) ×3 IMPLANT
PAD ARMBOARD 7.5X6 YLW CONV (MISCELLANEOUS) ×6 IMPLANT
PENCIL BUTTON HOLSTER BLD 10FT (ELECTRODE) ×3 IMPLANT
SPECIMEN JAR SMALL (MISCELLANEOUS) IMPLANT
SPONGE GAUZE 2X2 STER 10/PKG (GAUZE/BANDAGES/DRESSINGS) ×2
SPONGE LAP 18X18 X RAY DECT (DISPOSABLE) ×3 IMPLANT
STRIP CLOSURE SKIN 1/2X4 (GAUZE/BANDAGES/DRESSINGS) ×2 IMPLANT
SUT MON AB 4-0 PC3 18 (SUTURE) ×3 IMPLANT
SUT SILK 2 0 SH (SUTURE) ×2 IMPLANT
SUT VIC AB 2-0 CT1 27 (SUTURE) ×6
SUT VIC AB 2-0 CT1 TAPERPNT 27 (SUTURE) ×2 IMPLANT
SUT VIC AB 3-0 CT1 27 (SUTURE) ×3
SUT VIC AB 3-0 CT1 TAPERPNT 27 (SUTURE) ×1 IMPLANT
SYR CONTROL 10ML LL (SYRINGE) ×3 IMPLANT
TOWEL OR 17X24 6PK STRL BLUE (TOWEL DISPOSABLE) ×1 IMPLANT
TOWEL OR 17X26 10 PK STRL BLUE (TOWEL DISPOSABLE) ×3 IMPLANT

## 2013-06-05 NOTE — Interval H&P Note (Signed)
History and Physical Interval Note: no change in H and P  06/05/2013 7:06 AM  Brent Morrison  has presented today for surgery, with the diagnosis of RIGHT INGUINAL HERNIA   The various methods of treatment have been discussed with the patient and family. After consideration of risks, benefits and other options for treatment, the patient has consented to  Procedure(s): HERNIA REPAIR INGUINAL ADULT (Right) INSERTION OF MESH (Right) as a surgical intervention .  The patient's history has been reviewed, patient examined, no change in status, stable for surgery.  I have reviewed the patient's chart and labs.  Questions were answered to the patient's satisfaction.     [name]

## 2013-06-05 NOTE — Anesthesia Preprocedure Evaluation (Addendum)
Anesthesia Evaluation  Patient identified by MRN, date of birth, ID band Patient awake    Reviewed: Allergy & Precautions, H&P , NPO status , Patient's Chart, lab work & pertinent test results  History of Anesthesia Complications (+) history of anesthetic complications  Airway Mallampati: II TM Distance: >3 FB Neck ROM: Full    Dental  (+) Dental Advisory Given and Teeth Intact   Pulmonary former smoker,          Cardiovascular hypertension, Pt. on medications     Neuro/Psych    GI/Hepatic   Endo/Other  Hypothyroidism   Renal/GU Renal InsufficiencyRenal disease     Musculoskeletal   Abdominal   Peds  Hematology   Anesthesia Other Findings Gout BPH  Reproductive/Obstetrics                        Anesthesia Physical Anesthesia Plan  ASA: II  Anesthesia Plan: General   Post-op Pain Management:    Induction: Intravenous  Airway Management Planned: LMA and Oral ETT  Additional Equipment:   Intra-op Plan:   Post-operative Plan: Extubation in OR  Informed Consent:   Dental advisory given  Plan Discussed with: Anesthesiologist and Surgeon  Anesthesia Plan Comments:        Anesthesia Quick Evaluation

## 2013-06-05 NOTE — Preoperative (Signed)
Beta Blockers   Reason not to administer Beta Blockers:Not Applicable 

## 2013-06-05 NOTE — Anesthesia Procedure Notes (Signed)
Procedure Name: LMA Insertion Date/Time: 06/05/2013 8:28 AM Performed by: Carola Frost Pre-anesthesia Checklist: Patient identified and Timeout performed Patient Re-evaluated:Patient Re-evaluated prior to inductionOxygen Delivery Method: Circle system utilized Preoxygenation: Pre-oxygenation with 100% oxygen Intubation Type: IV induction Ventilation: Mask ventilation without difficulty LMA: LMA inserted LMA Size: 4.0 Placement Confirmation: positive ETCO2 and breath sounds checked- equal and bilateral Tube secured with: Tape Dental Injury: Teeth and Oropharynx as per pre-operative assessment

## 2013-06-05 NOTE — Discharge Instructions (Signed)
[facility] _______[facility]  UMBILICAL OR INGUINAL HERNIA REPAIR: POST OP INSTRUCTIONS  Always review your discharge instruction sheet given to you by the facility where your surgery was performed. IF YOU HAVE DISABILITY OR FAMILY LEAVE FORMS, YOU MUST BRING THEM TO THE OFFICE FOR PROCESSING.   DO NOT GIVE THEM TO YOUR DOCTOR.  1. A  prescription for pain medication may be given to you upon discharge.  Take your pain medication as prescribed, if needed.  If narcotic pain medicine is not needed, then you may take acetaminophen (Tylenol) or ibuprofen (Advil) as needed. 2. Take your usually prescribed medications unless otherwise directed. 3. If you need a refill on your pain medication, please contact your pharmacy.  They will contact our office to request authorization. Prescriptions will not be filled after 5 pm or on week-ends. 4. You should follow a light diet the first 24 hours after arrival home, such as soup and crackers, etc.  Be sure to include lots of fluids daily.  Resume your normal diet the day after surgery. 5. Most patients will experience some swelling and bruising around the umbilicus or in the groin and scrotum.  Ice packs and reclining will help.  Swelling and bruising can take several days to resolve.  6. It is common to experience some constipation if taking pain medication after surgery.  Increasing fluid intake and taking a stool softener (such as Colace) will usually help or prevent this problem from occurring.  A mild laxative (Milk of Magnesia or Miralax) should be taken according to package directions if there are no bowel movements after 48 hours. 7. Unless discharge instructions indicate otherwise, you may remove your bandages 24-48 hours after surgery, and you may shower at that time.  You may have steri-strips (small skin tapes) in place directly over the incision.  These strips should be left on the skin for 7-10 days.  If your surgeon used skin glue on the  incision, you may shower in 24 hours.  The glue will flake off over the next 2-3 weeks.  Any sutures or staples will be removed at the office during your follow-up visit. 8. ACTIVITIES:  You may resume regular (light) daily activities beginning the next day--such as daily self-care, walking, climbing stairs--gradually increasing activities as tolerated.  You may have sexual intercourse when it is comfortable.  Refrain from any heavy lifting or straining until approved by your doctor. a. You may drive when you are no longer taking prescription pain medication, you can comfortably wear a seatbelt, and you can safely maneuver your car and apply brakes. b. RETURN TO WORK:  __________________________________________________________ 9. You should see your doctor in the office for a follow-up appointment approximately 2-3 weeks after your surgery.  Make sure that you call for this appointment within a day or two after you arrive home to insure a convenient appointment time. 10. OTHER INSTRUCTIONS: ICE PACK ALSO FOR PAIN 11. NO LIFTING MORE THAN 15 POUNDS FOR 4 WEEKS __________________________________________________________________________________________________________________________________________________________________________________________  WHEN TO CALL YOUR DOCTOR: 1. Fever over 101.0 2. Inability to urinate 3. Nausea and/or vomiting 4. Extreme swelling or bruising 5. Continued bleeding from incision. 6. Increased pain, redness, or drainage from the incision  The clinic staff is available to answer your questions during regular business hours.  Please dont hesitate to call and ask to speak to one of the nurses for clinical concerns.  If you have a medical emergency, go to the nearest emergency room or call 911.  A  surgeon from [facility] is always on call at the hospital   [address] [place], [place], [place]  [address] ?  [address], [place], [place]   [address] [phone] ? [phone] ? FAX [phone] Web site: [email or web address]  What to eat:  For your first meals, you should eat lightly; only small meals initially.  If you do not have nausea, you may eat larger meals.  Avoid spicy, greasy and heavy food.    General Anesthesia, Adult, Care After  Refer to this sheet in the next few weeks. These instructions provide you with information on caring for yourself after your procedure. Your health care provider may also give you more specific instructions. Your treatment has been planned according to current medical practices, but problems sometimes occur. Call your health care provider if you have any problems or questions after your procedure.  WHAT TO EXPECT AFTER THE PROCEDURE  After the procedure, it is typical to experience:  Sleepiness.  Nausea and vomiting. HOME CARE INSTRUCTIONS  For the first 24 hours after general anesthesia:  Have a responsible person with you.  Do not drive a car. If you are alone, do not take public transportation.  Do not drink alcohol.  Do not take medicine that has not been prescribed by your health care provider.  Do not sign important papers or make important decisions.  You may resume a normal diet and activities as directed by your health care provider.  Change bandages (dressings) as directed.  If you have questions or problems that seem related to general anesthesia, call the hospital and ask for the anesthetist or anesthesiologist on call. SEEK MEDICAL CARE IF:  You have nausea and vomiting that continue the day after anesthesia.  You develop a rash. SEEK IMMEDIATE MEDICAL CARE IF:  You have difficulty breathing.  You have chest pain.  You have any allergic problems. Document Released: 07/19/2000 Document Revised: 12/13/2012 Document Reviewed: 10/26/2012  [facility] Patient Information 2014 [place], [place].

## 2013-06-05 NOTE — Transfer of Care (Signed)
Immediate Anesthesia Transfer of Care Note  Patient: Brent Morrison  Procedure(s) Performed: Procedure(s): HERNIA REPAIR INGUINAL ADULT (Right) INSERTION OF MESH (Right)  Patient Location: PACU  Anesthesia Type:General  Level of Consciousness: awake and alert   Airway & Oxygen Therapy: Patient Spontanous Breathing and Patient connected to nasal cannula oxygen  Post-op Assessment: Report given to PACU RN and Post -op Vital signs reviewed and stable  Post vital signs: Reviewed and stable  Complications: No apparent anesthesia complications

## 2013-06-05 NOTE — Progress Notes (Signed)
Upon arrival to [facility], pt. C/o of "gassy stomach from eating greens last p.m.", states he is belching all the way to hosp.   discomfort in general low abdominal area. Pt. Reports that he takes Gaviscon at home if he feels this way. Call to [facility] telephone order for oral , liquid antacid. Call to Pharmacy, spoke with Janett Billow, order filled & awaiting arrival in [facility].

## 2013-06-05 NOTE — Op Note (Signed)
HERNIA REPAIR INGUINAL ADULT, INSERTION OF MESH  Procedure Note  Brent Morrison 06/05/2013   Pre-op Diagnosis: RIGHT INGUINAL HERNIA      Post-op Diagnosis: same  Procedure(s): HERNIA REPAIR INGUINAL ADULT INSERTION OF MESH  Surgeon(s): Harl Bowie, MD  Anesthesia: General  Staff:  Circulator: Luberta Mutter, RN; Denna Haggard, RN Scrub Person: Delrae Alfred, CST OR Clinical Technician: Milana Kidney  Estimated Blood Loss: Minimal                         [name]   Date: 06/05/2013  Time: 9:09 AM

## 2013-06-05 NOTE — Anesthesia Postprocedure Evaluation (Signed)
  Anesthesia Post-op Note  Patient: Brent Morrison  Procedure(s) Performed: Procedure(s): HERNIA REPAIR INGUINAL ADULT (Right) INSERTION OF MESH (Right)  Patient Location: PACU  Anesthesia Type:General  Level of Consciousness: awake, alert , oriented and patient cooperative  Airway and Oxygen Therapy: Patient Spontanous Breathing  Post-op Pain: mild  Post-op Assessment: Post-op Vital signs reviewed, Patient's Cardiovascular Status Stable, Respiratory Function Stable, Patent Airway, No signs of Nausea or vomiting and Pain level controlled  Post-op Vital Signs: stable  Complications: No apparent anesthesia complications

## 2013-06-06 MED ORDER — DOCUSATE SODIUM 100 MG PO CAPS
100.0000 mg | ORAL_CAPSULE | Freq: Every day | ORAL | Status: AC
  Administered 2013-06-06: 100 mg via ORAL
  Filled 2013-06-06: qty 1

## 2013-06-06 MED ORDER — POLYETHYLENE GLYCOL 3350 17 G PO PACK
17.0000 g | PACK | Freq: Once | ORAL | Status: AC
  Administered 2013-06-06: 17 g via ORAL
  Filled 2013-06-06: qty 1

## 2013-06-06 NOTE — Op Note (Signed)
NAME:  CORY, KITT NO.:  [id number]  MEDICAL RECORD NO.:  [id number]  LOCATION:  [id number]                        FACILITY:  [facility]  PHYSICIAN:  Coralie Keens, M.D. DATE OF BIRTH:  25-Apr-1933  DATE OF PROCEDURE:  06/05/2013 DATE OF DISCHARGE:                              OPERATIVE REPORT   PREOPERATIVE DIAGNOSIS:  Right inguinal hernia.  POSTOPERATIVE DIAGNOSIS:  Right inguinal hernia.  PROCEDURE:  Right inguinal hernia repair with mesh.  SURGEON:  Coralie Keens, M.D.  ANESTHESIA:  General and 0.5% Marcaine.  ESTIMATED BLOOD LOSS:  Minimal.  DESCRIPTION OF PROCEDURE:  The patient was brought to the operating room and identified as Herma Carson.  He was placed supine on the operating room table and general anesthesia was induced.  His lower abdomen was then prepped and draped in usual sterile fashion.  I performed a right ilioinguinal nerve block with Marcaine.  I then anesthetized the skin with Marcaine as well.  I made a longitudinal incision with a scalpel on the right groin.  I then took this down through Scarpa fascia with electrocautery.  I identified the external oblique fascia and opened it toward the internal and external rings.  The testicular cord and structures were controlled with Penrose drain.  The patient had a very large indirect hernia sac as well as a large cord lipoma.  I excised the cord lipoma with electrocautery.  I then dissected the sac down to the internal ring.  I reduced all contents back in the abdominal cavity and tied off the base of sac with a 2-0 silk suture.  I then excised the redundant sac.  Next, a piece of Parietex ProGrip Prolene mesh was brought to the field.  I placed as an onlay on the inguinal floor and then brought it around the cord structures.  I then secured it to the pubic tubercle and shelving edge of inguinal ligament with interrupted 2- 0 Vicryl sutures.  Good coverage of the inguinal floor and  internal ring appeared to be achieved.  I then closed the external oblique fascia over top of this with a running 2-0 Vicryl suture.  I anesthetized the fascia further with Marcaine.  I then closed the Scarpa fascia with interrupted 3-0 Vicryl sutures and closed the skin with a running 4-0 Monocryl. Steri-Strips, gauze, and Tegaderm were then applied.  The patient tolerated the procedure well.  All the counts were correct at the end of procedure.  The patient was then extubated in the operating room and taken in stable condition to recovery room.    Coralie Keens, M.D.    DB/MEDQ  D:  06/05/2013  T:  06/06/2013  Job:  [id number]

## 2013-06-06 NOTE — Discharge Summary (Signed)
Physician Discharge Summary  Patient ID: Amish Mintzer MRN: [id number] DOB/AGE: Aug 28, 1932 80 y.o.  Admit date: 06/05/2013 Discharge date: 06/06/2013  Admission Diagnoses:  Discharge Diagnoses:  Active Problems:   Hernia, abdominal   Discharged Condition: good  Hospital Course: patient keep overnight post op because he lives alone and has had prior severe post op urinary retention.  Did well overnight with no voiding issues.  Discharged home  Consults: None  Significant Diagnostic Studies:   Treatments: surgery: right inguinal hernia repair with mesh  Discharge Exam: Blood pressure 106/50, pulse 77, temperature 98.3 F (36.8 C), temperature source Oral, resp. rate 18, height 5\' 10"  (1.778 m), weight 163 lb (73.936 kg), SpO2 100.00%. Incision/Wound: abd soft, incision clean  Disposition: 01-Home or Self Care   Future Appointments Provider Department Dept Phone   06/19/2013 3:10 PM Harl Bowie, MD [facility] [place] [phone]       Medication List         aluminum hydroxide-magnesium carbonate 95-358 MG/15ML Susp  Commonly known as:  GAVISCON  Take 15 mLs by mouth as needed.     cholecalciferol 1000 UNITS tablet  Commonly known as:  VITAMIN D  Take 1,000 Units by mouth daily.     losartan-hydrochlorothiazide 100-25 MG per tablet  Commonly known as:  HYZAAR  Take 1 tablet by mouth daily.     saw palmetto 160 MG capsule  Take 160 mg by mouth 2 (two) times daily.     thyroid 15 MG tablet  Commonly known as:  ARMOUR  Take 1 tablet (15 mg total) by mouth daily before breakfast. Takes with 30 mg to equal 45 mg     thyroid 30 MG tablet  Commonly known as:  ARMOUR  Take 1 tablet (30 mg total) by mouth daily before breakfast. Takes with 15 mg to equal 45 mg     traMADol 50 MG tablet  Commonly known as:  ULTRAM  Take 1-2 tablets (50-100 mg total) by mouth every 6 (six) hours as needed.     vitamin C 500 MG tablet  Commonly known as:  ASCORBIC  ACID  Take 500 mg by mouth daily.           Follow-up Information   Follow up with [facility] A, MD. Call in 2 weeks.   Specialty:  General Surgery   Contact information:   [address] [place] [address] [phone]       Signed: Harl Bowie 06/06/2013, 6:01 AM

## 2013-06-06 NOTE — Progress Notes (Signed)
Discharge instructions reviewed with pt, pt's sister and friend and prescription given.  Pt, pt's sister and friend verbalized understanding and had no questions.  Pt discharged in stable condition via wheelchair with sister and friend.  Eliezer Bottom Hana

## 2013-06-06 NOTE — Progress Notes (Signed)
Patient ID: Brent Morrison, male   DOB: July 15, 1932, 80 y.o.   MRN: [id number]  No complaints Mild postop pain Voiding well  Plan: discharge

## 2013-06-07 ENCOUNTER — Encounter (HOSPITAL_COMMUNITY): Payer: Self-pay | Admitting: Surgery

## 2013-06-19 ENCOUNTER — Encounter (INDEPENDENT_AMBULATORY_CARE_PROVIDER_SITE_OTHER): Payer: Medicare Other | Admitting: Surgery

## 2013-07-09 ENCOUNTER — Ambulatory Visit (INDEPENDENT_AMBULATORY_CARE_PROVIDER_SITE_OTHER): Payer: Medicare Other | Admitting: Surgery

## 2013-07-09 ENCOUNTER — Encounter (INDEPENDENT_AMBULATORY_CARE_PROVIDER_SITE_OTHER): Payer: Self-pay | Admitting: Surgery

## 2013-07-09 VITALS — BP 122/81 | HR 62 | Temp 97.8°F | Resp 18 | Ht 70.5 in | Wt 164.6 lb

## 2013-07-09 DIAGNOSIS — Z09 Encounter for follow-up examination after completed treatment for conditions other than malignant neoplasm: Secondary | ICD-10-CM

## 2013-07-09 NOTE — Progress Notes (Signed)
Subjective:     Patient ID: Brent Morrison, male   DOB: 05-Jan-1933, 80 y.o.   MRN: [id number]  HPI He is here for his first postop visit status post right inguinal hernia repair with mesh. He is doing well and has no complaints.  Review of Systems     Objective:   Physical Exam On exam, his incisions are healing well. There is no evidence of recurrent hernia    Assessment:     Patient stable postop     Plan:     He may resume his normal activity including lifting. I will see him back as needed.

## 2013-07-30 ENCOUNTER — Ambulatory Visit (INDEPENDENT_AMBULATORY_CARE_PROVIDER_SITE_OTHER): Payer: Medicare Other | Admitting: Physician Assistant

## 2013-07-30 ENCOUNTER — Encounter: Payer: Self-pay | Admitting: Physician Assistant

## 2013-07-30 VITALS — BP 99/58 | HR 65 | Ht 70.5 in | Wt 165.0 lb

## 2013-07-30 DIAGNOSIS — R031 Nonspecific low blood-pressure reading: Secondary | ICD-10-CM

## 2013-07-30 DIAGNOSIS — N183 Chronic kidney disease, stage 3 unspecified: Secondary | ICD-10-CM | POA: Diagnosis not present

## 2013-07-30 DIAGNOSIS — R42 Dizziness and giddiness: Secondary | ICD-10-CM | POA: Diagnosis not present

## 2013-07-30 DIAGNOSIS — E039 Hypothyroidism, unspecified: Secondary | ICD-10-CM

## 2013-07-30 DIAGNOSIS — Z79899 Other long term (current) drug therapy: Secondary | ICD-10-CM

## 2013-07-30 LAB — COMPLETE METABOLIC PANEL WITHOUT GFR
ALT: 11 U/L (ref 0–53)
AST: 14 U/L (ref 0–37)
Albumin: 3.7 g/dL (ref 3.5–5.2)
Alkaline Phosphatase: 60 U/L (ref 39–117)
BUN: 17 mg/dL (ref 6–23)
CO2: 31 meq/L (ref 19–32)
Calcium: 9.3 mg/dL (ref 8.4–10.5)
Chloride: 98 meq/L (ref 96–112)
Creat: 1.2 mg/dL (ref 0.50–1.35)
GFR, Est African American: 67 mL/min
GFR, Est Non African American: 58 mL/min — ABNORMAL LOW
Glucose, Bld: 92 mg/dL (ref 70–99)
Potassium: 4.2 meq/L (ref 3.5–5.3)
Sodium: 138 meq/L (ref 135–145)
Total Bilirubin: 0.4 mg/dL (ref 0.2–1.2)
Total Protein: 5.5 g/dL — ABNORMAL LOW (ref 6.0–8.3)

## 2013-07-30 LAB — MAGNESIUM: Magnesium: 1.7 mg/dL (ref 1.5–2.5)

## 2013-07-30 LAB — T4, FREE: Free T4: 1.23 ng/dL (ref 0.80–1.80)

## 2013-07-30 LAB — TSH: TSH: 0.948 u[IU]/mL (ref 0.350–4.500)

## 2013-07-30 NOTE — Progress Notes (Signed)
   Subjective:    Patient ID: Brent Morrison, male    DOB: [birth year], 80 y.o.   MRN: [id number]  HPI Pt is a 80 yo male who presents to the clinic for dizziness. He has noticed for last month that he will sometimes "stagger". Denies every falling or passing out. Denies any headaches, fever, chills. Not all the time only occasional. Some days does not happen at all.  Does not feel bad. Also he wants his thyroid checked it has been 6 months. He has CKD and concerned something might be wrong with electrolytes.   Review of Systems     Objective:   Physical Exam  Constitutional: He is oriented to person, place, and time. He appears well-developed and well-nourished.  HENT:  Head: Normocephalic and atraumatic.  Right Ear: External ear normal.  Left Ear: External ear normal.  Nose: Nose normal.  Mouth/Throat: Oropharynx is clear and moist.  Eyes: Conjunctivae and EOM are normal. Pupils are equal, round, and reactive to light. Right eye exhibits no discharge. Left eye exhibits no discharge.  Neck: Normal range of motion. Neck supple.  Cardiovascular: Normal rate, regular rhythm and normal heart sounds.   Pulmonary/Chest: Effort normal and breath sounds normal. He has no wheezes.  Lymphadenopathy:    He has no cervical adenopathy.  Neurological: He is alert and oriented to person, place, and time.  Skin: Skin is dry.  Psychiatric: He has a normal mood and affect. His behavior is normal.          Assessment & Plan:  Low blood pressure reading/CKD/Dizziness- BP could be running lower than usual. Let's cut hyzaar in half to take daily. Recheck in 2 week for BP. Will check electrolytes, kidney function, and magnesium today. Dizziness does not seem to be consistant with BPV and likely I think when BP low and he changes position he might feel dizzy. Encouraged pt to stay hydrated.   Hypothyroidism- will recheck today.

## 2013-07-30 NOTE — Patient Instructions (Signed)
Cut Hyzaar in half and follow up in 2 weeks.

## 2013-08-06 ENCOUNTER — Encounter: Payer: Self-pay | Admitting: Physician Assistant

## 2013-08-06 ENCOUNTER — Ambulatory Visit (INDEPENDENT_AMBULATORY_CARE_PROVIDER_SITE_OTHER): Payer: Medicare Other | Admitting: Physician Assistant

## 2013-08-06 VITALS — BP 109/66 | HR 69 | Wt 164.0 lb

## 2013-08-06 DIAGNOSIS — R42 Dizziness and giddiness: Secondary | ICD-10-CM

## 2013-08-06 DIAGNOSIS — H60399 Other infective otitis externa, unspecified ear: Secondary | ICD-10-CM | POA: Diagnosis not present

## 2013-08-06 DIAGNOSIS — R600 Localized edema: Secondary | ICD-10-CM

## 2013-08-06 DIAGNOSIS — H6120 Impacted cerumen, unspecified ear: Secondary | ICD-10-CM | POA: Diagnosis not present

## 2013-08-06 DIAGNOSIS — R031 Nonspecific low blood-pressure reading: Secondary | ICD-10-CM

## 2013-08-06 DIAGNOSIS — H6092 Unspecified otitis externa, left ear: Secondary | ICD-10-CM

## 2013-08-06 DIAGNOSIS — H6122 Impacted cerumen, left ear: Secondary | ICD-10-CM

## 2013-08-06 DIAGNOSIS — R609 Edema, unspecified: Secondary | ICD-10-CM

## 2013-08-06 MED ORDER — OFLOXACIN 0.3 % OT SOLN: 10.0000 [drp] | Freq: Every day | OTIC | Status: DC

## 2013-08-06 MED ORDER — HYDROCHLOROTHIAZIDE 12.5 MG PO TABS: 25.0000 mg | ORAL_TABLET | Freq: Every day | ORAL | Status: DC

## 2013-08-06 NOTE — Progress Notes (Signed)
   Subjective:    Patient ID: Brent Morrison, male    DOB: [birth year], 80 y.o.   MRN: [id number]  HPI Pt is a 80 yo male who presents to the clinic with ongoing dizziness. BP was low at last visit and we decrease hyzaar by half. He has tolerated well but now has some increased swelling of lower legs. When he is sitting he is fine. If he goes to walk across the road to his rent house he will get dizzy multiple times. Denies any SOB, cough, chest tightness wheezing. No problems sleeping. Does not use pillows for elevation at night. Does not feel fatigue. Left ear feels itchy and congested. No sinus pressure, ST, fever, chills, n/v/d. He wants a carotid ultrasound since his wife had it. No CP, palpitations. Denies falling. Admits to some allergies but not taking anything for them. Labs were unremarkable at last visit.    Review of Systems     Objective:   Physical Exam  Constitutional: He is oriented to person, place, and time. He appears well-developed and well-nourished.  HENT:  Head: Normocephalic and atraumatic.  Nose: Nose normal.  Mouth/Throat: Oropharynx is clear and moist. No oropharyngeal exudate.  Eyes: Conjunctivae and EOM are normal. Pupils are equal, round, and reactive to light. Right eye exhibits no discharge. Left eye exhibits no discharge.  Neck: Normal range of motion. Neck supple. No JVD present.  No bruits on exam today.   Cardiovascular: Normal rate, regular rhythm and normal heart sounds.   No murmur heard. Pulmonary/Chest: Effort normal and breath sounds normal. He has no wheezes. He has no rales.  Lymphadenopathy:    He has no cervical adenopathy.  Neurological: He is alert and oriented to person, place, and time. No cranial nerve deficit.  Negative DixHallipike. Able to touch finger to nose without difficultly.  Able to slide feet down legs without difficultly.   Skin:  1+ bilateral ankle edema.   Psychiatric: He has a normal mood and affect. His behavior is  normal.          Assessment & Plan:  Low blood pressure/bilateral leg edema/dizziness- pt has cut hyzaar in half and has brought BP up a little but caused swelling in his legs to increase. Stop hyzaar. Start HCTZ 25mg  daily only. Follow up in 2 weeks. Will recheck kidney function and BP. Low BP could certainly be causes dizziness or potentially inner ear problems. dix hallipike was negative It does not present like BPV. Pt request carotid doppler. I discussed with pt we do not routinely screen and he does not have any symptoms suggestive of stenosis that needs to be treated. Pt insists and I ordered.   Left external otitis externa/cerumen impaction- Nurse irrigated ears. There was significant erythema and swelling treated with olfoxacin for 7 days. Discussed with pt using a nasal spray OTC flonase/nascort daily as needed.

## 2013-08-06 NOTE — Patient Instructions (Signed)
Stop Hyzar. Start HCTZ 25mg .

## 2013-08-07 ENCOUNTER — Encounter: Payer: Self-pay | Admitting: Physician Assistant

## 2013-08-07 ENCOUNTER — Ambulatory Visit (HOSPITAL_BASED_OUTPATIENT_CLINIC_OR_DEPARTMENT_OTHER)
Admission: RE | Admit: 2013-08-07 | Discharge: 2013-08-07 | Disposition: A | Discharge status: Home / Self Care | Payer: Medicare Other | Source: Ambulatory Visit | Attending: Physician Assistant | Admitting: Physician Assistant

## 2013-08-07 DIAGNOSIS — I6529 Occlusion and stenosis of unspecified carotid artery: Secondary | ICD-10-CM | POA: Diagnosis not present

## 2013-08-07 DIAGNOSIS — R42 Dizziness and giddiness: Secondary | ICD-10-CM | POA: Insufficient documentation

## 2013-08-07 DIAGNOSIS — I658 Occlusion and stenosis of other precerebral arteries: Secondary | ICD-10-CM | POA: Diagnosis not present

## 2013-08-07 MED ORDER — OFLOXACIN 0.3 % OT SOLN: 10.0000 [drp] | Freq: Every day | OTIC | Status: DC

## 2013-08-07 MED ORDER — HYDROCHLOROTHIAZIDE 12.5 MG PO TABS: 25.0000 mg | ORAL_TABLET | Freq: Every day | ORAL | Status: DC

## 2013-08-17 ENCOUNTER — Ambulatory Visit: Payer: Medicare Other | Admitting: Physician Assistant

## 2013-08-17 ENCOUNTER — Encounter: Payer: Self-pay | Admitting: Physician Assistant

## 2013-08-17 ENCOUNTER — Ambulatory Visit (INDEPENDENT_AMBULATORY_CARE_PROVIDER_SITE_OTHER): Payer: Medicare Other | Admitting: Physician Assistant

## 2013-08-17 VITALS — BP 107/59 | HR 76 | Wt 167.0 lb

## 2013-08-17 DIAGNOSIS — H9209 Otalgia, unspecified ear: Secondary | ICD-10-CM | POA: Diagnosis not present

## 2013-08-17 DIAGNOSIS — R600 Localized edema: Secondary | ICD-10-CM

## 2013-08-17 DIAGNOSIS — R609 Edema, unspecified: Secondary | ICD-10-CM

## 2013-08-17 DIAGNOSIS — H9202 Otalgia, left ear: Secondary | ICD-10-CM

## 2013-08-17 DIAGNOSIS — J309 Allergic rhinitis, unspecified: Secondary | ICD-10-CM | POA: Diagnosis not present

## 2013-08-17 DIAGNOSIS — J302 Other seasonal allergic rhinitis: Secondary | ICD-10-CM

## 2013-08-17 MED ORDER — BUMETANIDE 0.5 MG PO TABS: 0.5000 mg | ORAL_TABLET | Freq: Every day | ORAL | Status: DC

## 2013-08-17 NOTE — Patient Instructions (Addendum)
Try Flonase 2 sprays each nostril once a day. Try Zyrtec daily OTC.   Take bumex daily for swelling for 2 weeks then as needed.   Compression stockings would be a good addition.   Follow up in 4 weeks.

## 2013-08-20 NOTE — Progress Notes (Signed)
   Subjective:    Patient ID: Brent Morrison, male    DOB: [birth year], 80 y.o.   MRN: [id number]  HPI Pt presents to the clinic with left sided ankle edema. In the past has been both ankle but today left only. Pt has previously been seen for dizziness and adjustments were made due to low BP that could be causing dizziness. His diruretic was cut in half orginally and then increase to HCTZ 25mg  daily. That has still not controlled swelling. His BP is still low but reports no more dizziness. Feels like the swelling might be getting worse. Has not been elevating feet and denies any compression. No SOB, CP, headaches.   Pt does feel like he is having some watery eyes and allergy symptoms. Not taking anything to make better. Wonders what to do. He also has gotten acute pain of left ear recently. Lasted seconds and then went away. No other pains like that. No hearing loss. No ear discharge.    Review of Systems     Objective:   Physical Exam  Constitutional: He is oriented to person, place, and time. He appears well-developed and well-nourished.  HENT:  Head: Normocephalic and atraumatic.  Right Ear: External ear normal.  Left Ear: External ear normal.  Nose: Nose normal.  Mouth/Throat: Oropharynx is clear and moist. No oropharyngeal exudate.  TM's clear bilaterally.   Eyes: Conjunctivae are normal. Right eye exhibits no discharge. Left eye exhibits no discharge.  Neck: Normal range of motion. Neck supple. No JVD present. No tracheal deviation present.  Cardiovascular: Normal rate, regular rhythm and normal heart sounds.   Pulmonary/Chest: Effort normal and breath sounds normal. No stridor.  Neurological: He is alert and oriented to person, place, and time.  Skin:  Left ankle edema mild at 1+ edema.   Psychiatric: He has a normal mood and affect. His behavior is normal.          Assessment & Plan:  Left lower ankle edema- discussed we could change diruretic today. He feels like lasix did  not work on him as well in the past. I did give bumex .5mg  daily to start for 2 weeks then as needed and to stop HCTZ.  Pt may not need to be only daily since not needed for BP control. Discussed with pt important to monitor kidney function and make sure not drying him out too much. Follow up in 2-4 weeks for recheck. Discussed feet elevation when sitted as well as compression knee stockings. Call if swelling getting worse may need to increase diruretic. Last kidney function testing was a couple of weeks ago and stable.   Seasonal allergies- discussed daily zyrtec or claritin and addition of flonase nasal spray as needed or daily.

## 2013-08-24 ENCOUNTER — Ambulatory Visit: Payer: Medicare Other | Admitting: Physician Assistant

## 2013-08-30 ENCOUNTER — Telehealth: Payer: Self-pay | Admitting: Family Medicine

## 2013-08-30 NOTE — Telephone Encounter (Signed)
Pt wants to switch from [facility] to [facility].

## 2013-08-30 NOTE — Telephone Encounter (Signed)
Ok on my end.

## 2013-08-31 NOTE — Telephone Encounter (Signed)
Ok with me 

## 2013-09-07 IMAGING — CR DG CHEST 1V
1 series · 1 of 1 positions shown · non-contrast
Comparison: 03/20/2012

CLINICAL DATA: Abnormal chest x-ray with question nodular densities
versus nipple shadows

CHEST - 1 VIEW

[view not recorded]
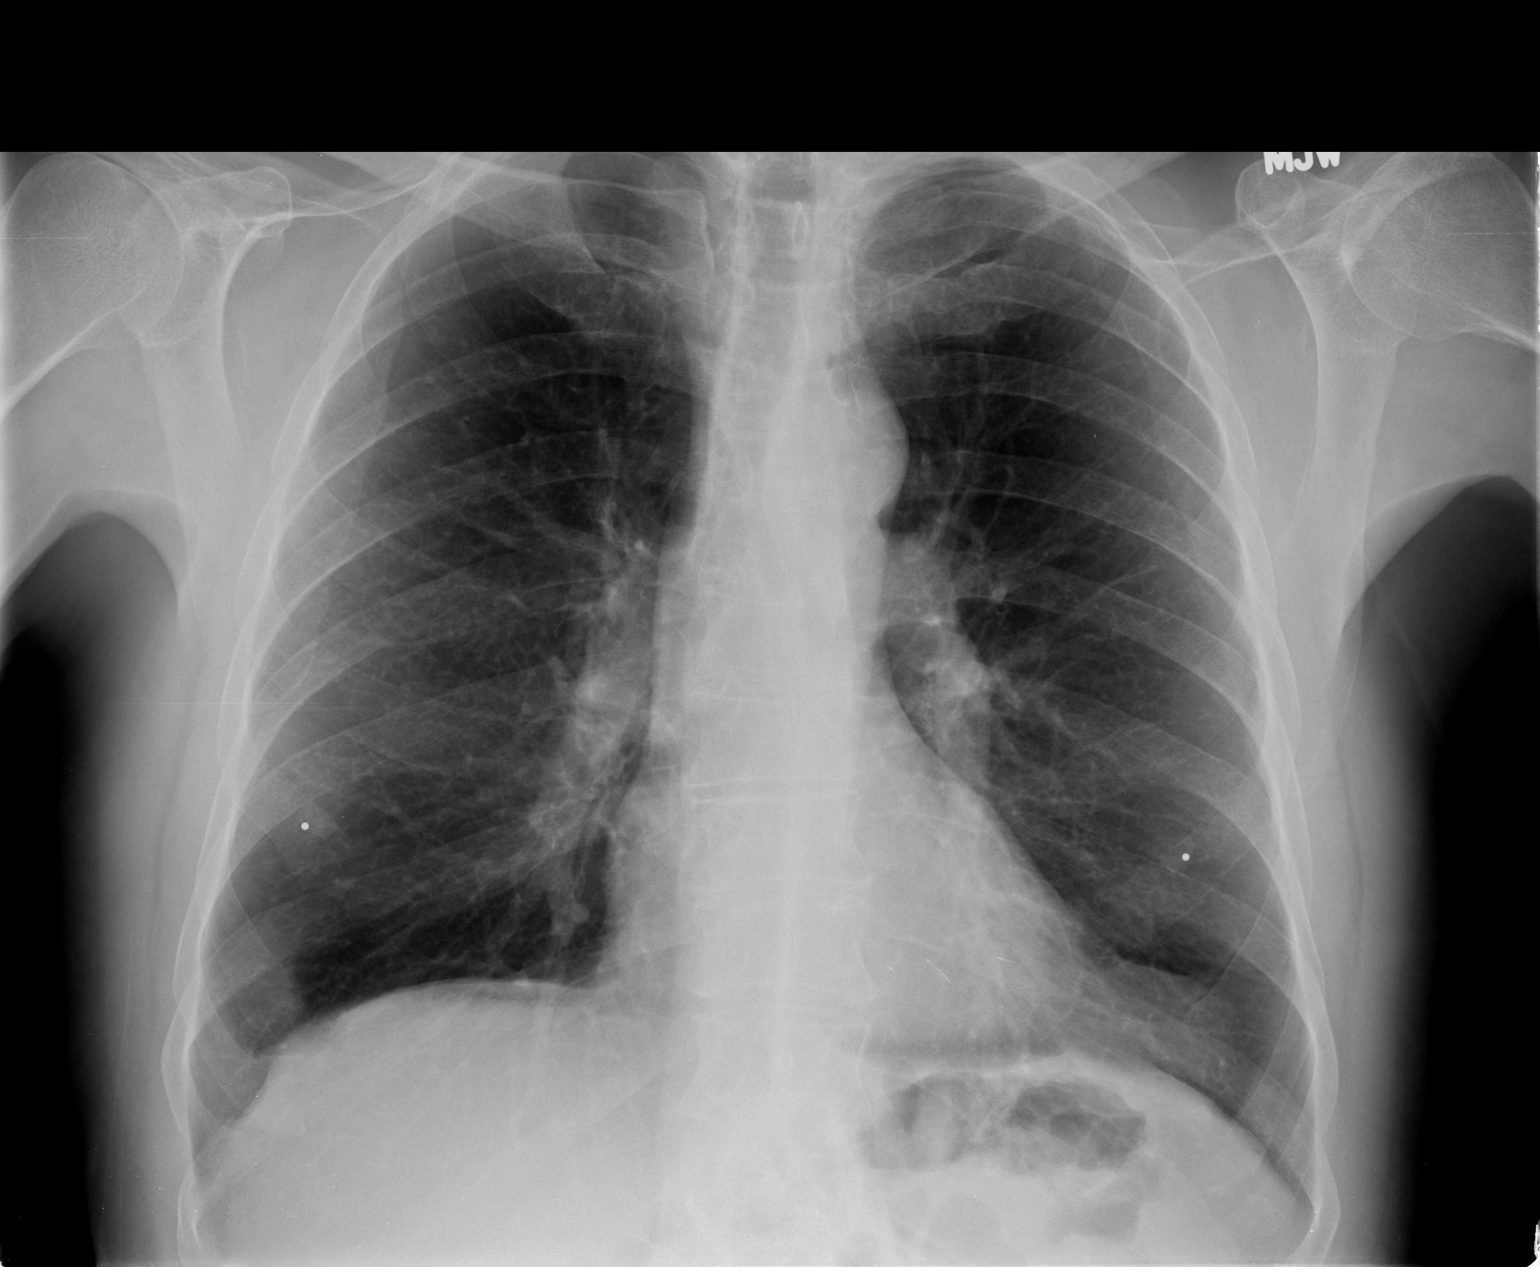

[1 of 1 positions shown; findings below may reference images not displayed]

FINDINGS: Normal heart size, mediastinal contours, and pulmonary vascularity.
Lungs clear.
Bilateral nipple shadows confirmed with nipple markers.
No infiltrate, pleural effusion, pneumothorax or mass/nodule
identified.
Bones unremarkable.
IMPRESSION: No acute abnormalities.
Bilateral nipple shadows on previous exam.

## 2013-09-14 ENCOUNTER — Ambulatory Visit: Payer: Medicare Other | Admitting: Physician Assistant

## 2013-09-14 DIAGNOSIS — N400 Enlarged prostate without lower urinary tract symptoms: Secondary | ICD-10-CM | POA: Diagnosis not present

## 2013-09-19 ENCOUNTER — Other Ambulatory Visit: Payer: Self-pay | Admitting: *Deleted

## 2013-09-19 DIAGNOSIS — E039 Hypothyroidism, unspecified: Secondary | ICD-10-CM

## 2013-09-19 MED ORDER — THYROID 15 MG PO TABS: 15.0000 mg | ORAL_TABLET | Freq: Every day | ORAL | Status: DC

## 2013-09-19 MED ORDER — THYROID 30 MG PO TABS: 30.0000 mg | ORAL_TABLET | Freq: Every day | ORAL | Status: DC

## 2013-09-21 ENCOUNTER — Encounter: Payer: Self-pay | Admitting: Physician Assistant

## 2013-09-21 ENCOUNTER — Ambulatory Visit (INDEPENDENT_AMBULATORY_CARE_PROVIDER_SITE_OTHER): Payer: Medicare Other | Admitting: Physician Assistant

## 2013-09-21 VITALS — BP 119/68 | HR 73 | Ht 70.5 in | Wt 167.0 lb

## 2013-09-21 DIAGNOSIS — R600 Localized edema: Secondary | ICD-10-CM

## 2013-09-21 DIAGNOSIS — R21 Rash and other nonspecific skin eruption: Secondary | ICD-10-CM

## 2013-09-21 DIAGNOSIS — R609 Edema, unspecified: Secondary | ICD-10-CM

## 2013-09-21 MED ORDER — FUROSEMIDE 40 MG PO TABS
ORAL_TABLET | ORAL | Status: DC
Start: 2013-09-21 — End: 2013-12-21

## 2013-09-21 MED ORDER — TRIAMCINOLONE ACETONIDE 0.1 % EX CREA: 1.0000 "application " | TOPICAL_CREAM | Freq: Two times a day (BID) | CUTANEOUS | Status: DC

## 2013-09-21 NOTE — Patient Instructions (Signed)
Start lasix tomorrow.  Shot given today.   Follow up in 4-6 weeks.

## 2013-09-24 ENCOUNTER — Telehealth: Payer: Self-pay | Admitting: Physician Assistant

## 2013-09-24 NOTE — Telephone Encounter (Signed)
Call pt: find out if lasix is working and decreasing edema? Also find out if cream has helped rash to resolve?

## 2013-09-24 NOTE — Progress Notes (Signed)
   Subjective:    Patient ID: Brent Morrison, male    DOB: 11-30-1932, 80 y.o.   MRN: [id number]  HPI Pt is a 80 yo male who presents to the clinic with potential medication reaction. Pt was started on bumex for bilaterally leg swelling. It was working well and his legs looked great per pt. He then developed a rash on his left arm that was red and itchy for about 3 days. He went to the pharmacy and pharmacy told him it probably was a sulfa rash from bumex. He continues to have rash on left forearm despite stopping Bumex for 2 days. He presents to the clinic because his legs have started to swell again. We had switched to Bumex because Lasix did not seem to be working as well. We took pt off lisinopril/HCTZ because decreasing his blood pressure too much and causing him to be dizzy. Dizziness has resolved. Patient denies any shortness of breath or wheezing. He denies any leg pain. He has not had fever or chills. Patient is outside a lot walking around.     Review of Systems  All other systems reviewed and are negative.      Objective:   Physical Exam  Constitutional: He is oriented to person, place, and time. He appears well-developed and well-nourished.  HENT:  Head: Normocephalic and atraumatic.  Cardiovascular: Normal rate, regular rhythm and normal heart sounds.   Pulmonary/Chest: Effort normal and breath sounds normal.  Musculoskeletal:       Arms: Neurological: He is alert and oriented to person, place, and time.  Skin: Skin is dry.  1+ pitting edema on right leg up to calf.  2+ pitting edema on left leg up to calf. No erythema. Some slight scaling of skin.   Psychiatric: He has a normal mood and affect. His behavior is normal.          Assessment & Plan:  Rash- I am not convinced this rash was a sulfa allergy. I did place an allergy with today. Patient was very persistent not to give him any sulfa drugs anymore. I did give him some triamcinolone cream to use twice a day on the  rash. It did seem to be a central papule scan that could represent a bug bite. Patient does walk around outside could have been exposed to many contact dermatitis. If rash worsens or continues please call office. Warned patient of red flags of severe reaction to allergies.   Bilateral leg edema- already stopped bumex. Lasix 20 IM given in office today. Lasix 40mg  daily was started. Discussed with pt on days where swelling was increase can increase to 2 tablets of lasix. Explained how compression stocking could significantly reduce his swelling.  Do not water restrict continue to stay hydrated through the summer on this diruretic. Please follow up in 4-6 weeks to make sure you are doing well.

## 2013-09-25 NOTE — Telephone Encounter (Signed)
Left message for patient to call back  

## 2013-09-26 ENCOUNTER — Other Ambulatory Visit: Payer: Self-pay | Admitting: *Deleted

## 2013-09-26 MED ORDER — LASIX 40 MG PO TABS: ORAL_TABLET | ORAL | Status: DC

## 2013-09-27 NOTE — Telephone Encounter (Signed)
40mg  is working. Pt asked for brand lasix.  rx sent to pharm.

## 2013-09-28 DIAGNOSIS — H2010 Chronic iridocyclitis, unspecified eye: Secondary | ICD-10-CM | POA: Diagnosis not present

## 2013-09-28 DIAGNOSIS — B0233 Zoster keratitis: Secondary | ICD-10-CM | POA: Diagnosis not present

## 2013-10-05 DIAGNOSIS — R609 Edema, unspecified: Secondary | ICD-10-CM | POA: Diagnosis not present

## 2013-10-05 MED ORDER — FUROSEMIDE 10 MG/ML IJ SOLN
20.0000 mg | Freq: Once | INTRAMUSCULAR | Status: AC
  Administered 2013-10-05: 20 mg via INTRAMUSCULAR

## 2013-10-05 NOTE — Addendum Note (Signed)
Addended by: Beatris Ship L on: 10/05/2013 09:03 AM   Modules accepted: Orders

## 2013-10-12 DIAGNOSIS — B009 Herpesviral infection, unspecified: Secondary | ICD-10-CM | POA: Diagnosis not present

## 2013-10-19 DIAGNOSIS — K5900 Constipation, unspecified: Secondary | ICD-10-CM | POA: Diagnosis not present

## 2013-10-19 DIAGNOSIS — Z86010 Personal history of colonic polyps: Secondary | ICD-10-CM | POA: Diagnosis not present

## 2013-11-04 DIAGNOSIS — M25569 Pain in unspecified knee: Secondary | ICD-10-CM | POA: Diagnosis not present

## 2013-11-04 DIAGNOSIS — M109 Gout, unspecified: Secondary | ICD-10-CM | POA: Diagnosis not present

## 2013-11-04 DIAGNOSIS — M25469 Effusion, unspecified knee: Secondary | ICD-10-CM | POA: Diagnosis not present

## 2013-11-04 DIAGNOSIS — M1710 Unilateral primary osteoarthritis, unspecified knee: Secondary | ICD-10-CM | POA: Diagnosis not present

## 2013-11-04 DIAGNOSIS — IMO0002 Reserved for concepts with insufficient information to code with codable children: Secondary | ICD-10-CM | POA: Diagnosis not present

## 2013-11-08 DIAGNOSIS — M109 Gout, unspecified: Secondary | ICD-10-CM | POA: Diagnosis not present

## 2013-11-27 DIAGNOSIS — R059 Cough, unspecified: Secondary | ICD-10-CM | POA: Diagnosis not present

## 2013-11-27 DIAGNOSIS — M109 Gout, unspecified: Secondary | ICD-10-CM | POA: Diagnosis not present

## 2013-11-27 DIAGNOSIS — R0602 Shortness of breath: Secondary | ICD-10-CM | POA: Diagnosis not present

## 2013-11-27 DIAGNOSIS — R05 Cough: Secondary | ICD-10-CM | POA: Diagnosis not present

## 2013-11-27 DIAGNOSIS — R609 Edema, unspecified: Secondary | ICD-10-CM | POA: Diagnosis not present

## 2013-12-03 DIAGNOSIS — Z09 Encounter for follow-up examination after completed treatment for conditions other than malignant neoplasm: Secondary | ICD-10-CM | POA: Diagnosis not present

## 2013-12-03 DIAGNOSIS — M109 Gout, unspecified: Secondary | ICD-10-CM | POA: Diagnosis not present

## 2013-12-03 DIAGNOSIS — R609 Edema, unspecified: Secondary | ICD-10-CM | POA: Diagnosis not present

## 2013-12-03 DIAGNOSIS — Z79899 Other long term (current) drug therapy: Secondary | ICD-10-CM | POA: Diagnosis not present

## 2013-12-05 DIAGNOSIS — Z961 Presence of intraocular lens: Secondary | ICD-10-CM | POA: Diagnosis not present

## 2013-12-05 DIAGNOSIS — Z9889 Other specified postprocedural states: Secondary | ICD-10-CM | POA: Diagnosis not present

## 2013-12-05 DIAGNOSIS — B0052 Herpesviral keratitis: Secondary | ICD-10-CM | POA: Diagnosis not present

## 2013-12-05 DIAGNOSIS — H548 Legal blindness, as defined in USA: Secondary | ICD-10-CM | POA: Diagnosis not present

## 2013-12-05 DIAGNOSIS — H179 Unspecified corneal scar and opacity: Secondary | ICD-10-CM | POA: Diagnosis not present

## 2013-12-05 DIAGNOSIS — H348192 Central retinal vein occlusion, unspecified eye, stable: Secondary | ICD-10-CM | POA: Diagnosis not present

## 2013-12-17 DIAGNOSIS — N320 Bladder-neck obstruction: Secondary | ICD-10-CM | POA: Diagnosis not present

## 2013-12-17 DIAGNOSIS — R3911 Hesitancy of micturition: Secondary | ICD-10-CM | POA: Diagnosis not present

## 2013-12-20 DIAGNOSIS — N320 Bladder-neck obstruction: Secondary | ICD-10-CM | POA: Diagnosis not present

## 2013-12-20 DIAGNOSIS — N400 Enlarged prostate without lower urinary tract symptoms: Secondary | ICD-10-CM | POA: Diagnosis not present

## 2013-12-20 DIAGNOSIS — R3911 Hesitancy of micturition: Secondary | ICD-10-CM | POA: Diagnosis not present

## 2013-12-21 ENCOUNTER — Encounter: Payer: Self-pay | Admitting: Physician Assistant

## 2013-12-21 ENCOUNTER — Ambulatory Visit (INDEPENDENT_AMBULATORY_CARE_PROVIDER_SITE_OTHER): Payer: Medicare Other | Admitting: Physician Assistant

## 2013-12-21 ENCOUNTER — Telehealth: Payer: Self-pay

## 2013-12-21 VITALS — BP 111/68 | HR 78 | Ht 70.5 in | Wt 161.0 lb

## 2013-12-21 DIAGNOSIS — R609 Edema, unspecified: Secondary | ICD-10-CM

## 2013-12-21 DIAGNOSIS — R600 Localized edema: Secondary | ICD-10-CM

## 2013-12-21 DIAGNOSIS — Z79899 Other long term (current) drug therapy: Secondary | ICD-10-CM

## 2013-12-21 MED ORDER — FUROSEMIDE 10 MG/ML IJ SOLN
40.0000 mg | Freq: Once | INTRAMUSCULAR | Status: AC
  Administered 2013-12-21: 40 mg via INTRAMUSCULAR

## 2013-12-21 MED ORDER — TORSEMIDE 20 MG PO TABS: ORAL_TABLET | ORAL | Status: DC

## 2013-12-21 NOTE — Telephone Encounter (Signed)
[employer] insurance does not require a prior auth for echocardiography.

## 2013-12-22 LAB — COMPLETE METABOLIC PANEL WITH GFR
ALBUMIN: 3.8 g/dL (ref 3.5–5.2)
ALT: 12 U/L (ref 0–53)
AST: 17 U/L (ref 0–37)
Alkaline Phosphatase: 60 U/L (ref 39–117)
BUN: 13 mg/dL (ref 6–23)
CALCIUM: 8.9 mg/dL (ref 8.4–10.5)
CHLORIDE: 100 meq/L (ref 96–112)
CO2: 29 mEq/L (ref 19–32)
Creat: 1.13 mg/dL (ref 0.50–1.35)
GFR, Est African American: 71 mL/min
GFR, Est Non African American: 61 mL/min
Glucose, Bld: 87 mg/dL (ref 70–99)
POTASSIUM: 3.7 meq/L (ref 3.5–5.3)
SODIUM: 138 meq/L (ref 135–145)
TOTAL PROTEIN: 5.6 g/dL — AB (ref 6.0–8.3)
Total Bilirubin: 0.4 mg/dL (ref 0.2–1.2)

## 2013-12-22 LAB — ALBUMIN: Albumin: 3.8 g/dL (ref 3.5–5.2)

## 2013-12-22 LAB — MAGNESIUM: MAGNESIUM: 1.9 mg/dL (ref 1.5–2.5)

## 2013-12-24 NOTE — Progress Notes (Signed)
   Subjective:    Patient ID: Brent Morrison, male    DOB: [birth year], 80 y.o.   MRN: [id number]  HPI Pt is a 80 yo male with his girlfriend who presents to the clinic with bilateral leg edema. This has been an on again off again problem. He reports that lasix does not work for him. He denies any SOB. He does feel tired on and off again. Denies any CP, palpitations, nausea. He does have CKD, III. He was recently seen by neprhologist and ruled out any urinary obstruction. He concerned because had bloodwork done in [place] and stated albumin was low with magnesium.    Review of Systems  All other systems reviewed and are negative.      Objective:   Physical Exam  Constitutional: He is oriented to person, place, and time. He appears well-developed and well-nourished.  HENT:  Head: Normocephalic and atraumatic.  Neck: Normal range of motion. Neck supple. No JVD present.  Cardiovascular: Normal rate, regular rhythm and normal heart sounds.   Pulmonary/Chest: Effort normal and breath sounds normal.  Neurological: He is alert and oriented to person, place, and time.  Skin:  Right leg 1+ pitting edema not quite to mid-calf.  Left leg 2+ pitting edema up to mid calf.   Psychiatric: He has a normal mood and affect. His behavior is normal.          Assessment & Plan:  Bilateral leg edema- will recheck kidney function to make sure not effecting fluid retention. Will get echo of heart. Pt has been told what good compression stockings would do but repeatly does not wear them. I did wrap both legs in ace wraps today and showed pt's girlfriend how to do it. IM lasix 40mg  given today. swtiched oral lasix to toresemide 20mg  with taper up if not improving up to 100mg . Discussed low salt diet. Encouraged feet elevation. Follow up in 2 weeks. Call if swelling not improving.

## 2013-12-26 ENCOUNTER — Ambulatory Visit (HOSPITAL_BASED_OUTPATIENT_CLINIC_OR_DEPARTMENT_OTHER): Payer: Medicare Other

## 2014-01-02 ENCOUNTER — Encounter: Payer: Self-pay | Admitting: Physician Assistant

## 2014-01-02 ENCOUNTER — Other Ambulatory Visit (HOSPITAL_COMMUNITY): Payer: Self-pay | Admitting: Physician Assistant

## 2014-01-02 ENCOUNTER — Ambulatory Visit (HOSPITAL_BASED_OUTPATIENT_CLINIC_OR_DEPARTMENT_OTHER)
Admission: RE | Admit: 2014-01-02 | Discharge: 2014-01-02 | Disposition: A | Discharge status: Home / Self Care | Payer: Medicare Other | Source: Ambulatory Visit | Attending: Physician Assistant | Admitting: Physician Assistant

## 2014-01-02 DIAGNOSIS — I059 Rheumatic mitral valve disease, unspecified: Secondary | ICD-10-CM | POA: Insufficient documentation

## 2014-01-02 DIAGNOSIS — R0609 Other forms of dyspnea: Secondary | ICD-10-CM | POA: Diagnosis not present

## 2014-01-02 DIAGNOSIS — R0989 Other specified symptoms and signs involving the circulatory and respiratory systems: Secondary | ICD-10-CM | POA: Insufficient documentation

## 2014-01-02 DIAGNOSIS — R609 Edema, unspecified: Secondary | ICD-10-CM | POA: Diagnosis not present

## 2014-01-02 DIAGNOSIS — R9389 Abnormal findings on diagnostic imaging of other specified body structures: Secondary | ICD-10-CM | POA: Insufficient documentation

## 2014-01-02 NOTE — Progress Notes (Signed)
Echo Lab  2D Echocardiogram completed.  [place], RDCS 01/02/2014 12:16 PM

## 2014-01-04 ENCOUNTER — Other Ambulatory Visit: Payer: Self-pay | Admitting: Physician Assistant

## 2014-01-04 DIAGNOSIS — W19XXXS Unspecified fall, sequela: Secondary | ICD-10-CM

## 2014-01-04 DIAGNOSIS — R42 Dizziness and giddiness: Secondary | ICD-10-CM

## 2014-01-07 DIAGNOSIS — K5900 Constipation, unspecified: Secondary | ICD-10-CM | POA: Diagnosis not present

## 2014-01-07 DIAGNOSIS — K6389 Other specified diseases of intestine: Secondary | ICD-10-CM | POA: Diagnosis not present

## 2014-01-18 ENCOUNTER — Encounter: Payer: Self-pay | Admitting: Physician Assistant

## 2014-01-18 ENCOUNTER — Ambulatory Visit (INDEPENDENT_AMBULATORY_CARE_PROVIDER_SITE_OTHER): Payer: Medicare Other | Admitting: Physician Assistant

## 2014-01-18 VITALS — BP 107/67 | HR 78 | Ht 70.5 in | Wt 151.0 lb

## 2014-01-18 DIAGNOSIS — I951 Orthostatic hypotension: Secondary | ICD-10-CM

## 2014-01-18 DIAGNOSIS — R42 Dizziness and giddiness: Secondary | ICD-10-CM | POA: Diagnosis not present

## 2014-01-18 DIAGNOSIS — R609 Edema, unspecified: Secondary | ICD-10-CM | POA: Diagnosis not present

## 2014-01-18 DIAGNOSIS — R600 Localized edema: Secondary | ICD-10-CM

## 2014-01-18 MED ORDER — AMBULATORY NON FORMULARY MEDICATION: Status: DC

## 2014-01-18 NOTE — Patient Instructions (Addendum)
Mens Multivitamin to add to Vit D, Vitamin C, B12.  flonase 2 sprays each nostril once a day. OTC.   Continue diuretic up to 5 tabs as needed.  Wear compression stockings.   Orthostatic Hypotension Orthostatic hypotension is a sudden drop in blood pressure. It happens when you quickly stand up from a seated or lying position. You may feel dizzy or light-headed. This can last for just a few seconds or for up to a few minutes. It is usually not a serious problem. However, if this happens frequently or gets worse, it can be a sign of something more serious. CAUSES  Different things can cause orthostatic hypotension, including:   Loss of body fluids (dehydration).  Medicines that lower blood pressure.  Sudden changes in posture, such as standing up quickly after you have been sitting or lying down.  Taking too much of your medicine. SIGNS AND SYMPTOMS   Light-headedness or dizziness.   Fainting or near-fainting.   A fast heart rate.   Weakness.   Feeling tired (fatigue).  DIAGNOSIS  Your health care provider may do several things to help diagnose your condition and identify the cause. These may include:   Taking a medical history and doing a physical exam.  Checking your blood pressure. Your health care provider will check your blood pressure when you are:  Lying down.  Sitting.  Standing.  Using tilt table testing. In this test, you lie down on a table that moves from a lying position to a standing position. You will be strapped onto the table. This test monitors your blood pressure and heart rate when you are in different positions. TREATMENT  Treatment will vary depending on the cause. Possible treatments include:   Changing the dosage of your medicines.  Wearing compression stockings on your lower legs.  Standing up slowly after sitting or lying down.  Eating more salt.  Eating frequent, small meals.  In some cases, getting IV fluids.  Taking medicine  to enhance fluid retention. HOME CARE INSTRUCTIONS  Only take over-the-counter or prescription medicines as directed by your health care provider.  Follow your health care provider's instructions for changing the dosage of your current medicines.  Do not stop or adjust your medicine on your own.  Stand up slowly after sitting or lying down. This allows your body to adjust to the different position.  Wear compression stockings as directed.  Eat extra salt as directed.  Do not add extra salt to your diet unless directed to by your health care provider.  Eat frequent, small meals.  Avoid standing suddenly after eating.  Avoid hot showers or excessive heat as directed by your health care provider.  Keep all follow-up appointments. SEEK MEDICAL CARE IF:  You continue to feel dizzy or light-headed after standing.  You feel groggy or confused.  You feel cold, clammy, or sick to your stomach (nauseous).  You have blurred vision.  You feel short of breath. SEEK IMMEDIATE MEDICAL CARE IF:   You faint after standing.  You have chest pain.  You have difficulty breathing.   You lose feeling or movement in your arms or legs.   You have slurred speech or difficulty talking, or you are unable to talk.  MAKE SURE YOU:   Understand these instructions.  Will watch your condition.  Will get help right away if you are not doing well or get worse. Document Released: 04/02/2002 Document Revised: 04/17/2013 Document Reviewed: 02/02/2013 [facility] Patient Information 2015 [place], [place]. This  information is not intended to replace advice given to you by your health care provider. Make sure you discuss any questions you have with your health care provider.   Dizziness Dizziness is a common problem. It is a feeling of unsteadiness or light-headedness. You may feel like you are about to faint. Dizziness can lead to injury if you stumble or fall. A person of any age group can suffer  from dizziness, but dizziness is more common in older adults. CAUSES  Dizziness can be caused by many different things, including:  Middle ear problems.  Standing for too long.  Infections.  An allergic reaction.  Aging.  An emotional response to something, such as the sight of blood.  Side effects of medicines.  Tiredness.  Problems with circulation or blood pressure.  Excessive use of alcohol or medicines, or illegal drug use.  Breathing too fast (hyperventilation).  An irregular heart rhythm (arrhythmia).  A low red blood cell count (anemia).  Pregnancy.  Vomiting, diarrhea, fever, or other illnesses that cause body fluid loss (dehydration).  Diseases or conditions such as Parkinson's disease, high blood pressure (hypertension), diabetes, and thyroid problems.  Exposure to extreme heat. DIAGNOSIS  Your health care provider will ask about your symptoms, perform a physical exam, and perform an electrocardiogram (ECG) to record the electrical activity of your heart. Your health care provider may also perform other heart or blood tests to determine the cause of your dizziness. These may include:  Transthoracic echocardiogram (TTE). During echocardiography, sound waves are used to evaluate how blood flows through your heart.  Transesophageal echocardiogram (TEE).  Cardiac monitoring. This allows your health care provider to monitor your heart rate and rhythm in real time.  Holter monitor. This is a portable device that records your heartbeat and can help diagnose heart arrhythmias. It allows your health care provider to track your heart activity for several days if needed.  Stress tests by exercise or by giving medicine that makes the heart beat faster. TREATMENT  Treatment of dizziness depends on the cause of your symptoms and can vary greatly. HOME CARE INSTRUCTIONS   Drink enough fluids to keep your urine clear or pale yellow. This is especially important in very  hot weather. In older adults, it is also important in cold weather.  Take your medicine exactly as directed if your dizziness is caused by medicines. When taking blood pressure medicines, it is especially important to get up slowly.  Rise slowly from chairs and steady yourself until you feel okay.  In the morning, first sit up on the side of the bed. When you feel okay, stand slowly while holding onto something until you know your balance is fine.  Move your legs often if you need to stand in one place for a long time. Tighten and relax your muscles in your legs while standing.  Have someone stay with you for 1-2 days if dizziness continues to be a problem. Do this until you feel you are well enough to stay alone. Have the person call your health care provider if he or she notices changes in you that are concerning.  Do not drive or use heavy machinery if you feel dizzy.  Do not drink alcohol. SEEK IMMEDIATE MEDICAL CARE IF:   Your dizziness or light-headedness gets worse.  You feel nauseous or vomit.  You have problems talking, walking, or using your arms, hands, or legs.  You feel weak.  You are not thinking clearly or you have trouble forming  sentences. It may take a friend or family member to notice this.  You have chest pain, abdominal pain, shortness of breath, or sweating.  Your vision changes.  You notice any bleeding.  You have side effects from medicine that seems to be getting worse rather than better. MAKE SURE YOU:   Understand these instructions.  Will watch your condition.  Will get help right away if you are not doing well or get worse. Document Released: 10/06/2000 Document Revised: 04/17/2013 Document Reviewed: 10/30/2010 [facility] Patient Information 2015 [place], [place]. This information is not intended to replace advice given to you by your health care provider. Make sure you discuss any questions you have with your health care provider.

## 2014-01-18 NOTE — Progress Notes (Signed)
   Subjective:    Patient ID: Brent Morrison, male    DOB: 09-07-1932, 80 y.o.   MRN: [id number]  HPI Pt presents to the clinic with dizziness. Happens when he stands or is walking. Goes away at rest. Edema is much better in legs with toresmide. He is using up to 3 tablets daily. Pt is concerned about dizziness.  We have evaluated heart with echo and carotid dopplers were both with no acute findings that recommended intervention.      Review of Systems  All other systems reviewed and are negative.      Objective:   Physical Exam  Constitutional: He is oriented to person, place, and time. He appears well-developed and well-nourished.  HENT:  Head: Normocephalic and atraumatic.  Right Ear: External ear normal.  Left Ear: External ear normal.  Nose: Nose normal.  Mouth/Throat: Oropharynx is clear and moist.  TM's appear clear.  Nasal turbinates and red and swollen.   Eyes: Conjunctivae are normal. Right eye exhibits no discharge. Left eye exhibits no discharge.  Neck: Normal range of motion. Neck supple. No thyromegaly present.  Cardiovascular: Normal rate, regular rhythm and normal heart sounds.   Pulmonary/Chest: Effort normal and breath sounds normal. He has no wheezes.  Lymphadenopathy:    He has no cervical adenopathy.  Neurological: He is alert and oriented to person, place, and time.  Skin:  2 plus right leg edema around ankles.  1 plus left leg edema around ankles.   Psychiatric: He has a normal mood and affect. His behavior is normal.          Assessment & Plan:  Dizziness/bilateral edema/orthostatic hypotension- orthostatics showed enough differences to characterize as orthostatic hypotension. Discussed with patient ways to manage this. I reiterated that pt needs to be wearing compression stocking daily. i think compression stocking would help with fluid retention and even dizziness by pushing up some of the blood back up to heart. Sent rx home with patient.   Possible there is some inner ear fluid. Encouraged pt to use flonase 2 sprays each nostril once a day. OTC. For swelling use up to 5 tablets as needed to keep down swelling. Unfortunately could be causing some of dizziness. Pt has appt with neurologist for further evaluation. Discussed to get up slow and hold on to object for stablity while BP normalizes.  i am not sure how much of dizziness and instability is age related.

## 2014-01-29 ENCOUNTER — Encounter: Payer: Self-pay | Admitting: Family Medicine

## 2014-01-30 ENCOUNTER — Telehealth: Payer: Self-pay

## 2014-01-30 ENCOUNTER — Other Ambulatory Visit: Payer: Self-pay | Admitting: Physician Assistant

## 2014-01-30 MED ORDER — POTASSIUM CHLORIDE ER 10 MEQ PO TBCR: 10.0000 meq | EXTENDED_RELEASE_TABLET | Freq: Every day | ORAL | Status: DC

## 2014-01-30 NOTE — Telephone Encounter (Signed)
Brent Morrison, Mr [place] friend, reports he is having leg cramps. He would like potassium supplement because of the cramps and he is on torsemide. Med [facility].

## 2014-01-30 NOTE — Telephone Encounter (Signed)
Ok sent last lab potassium lower normal. Take once a day. Follow up with lab recheck potassium in 2 weeks.

## 2014-01-31 NOTE — Telephone Encounter (Signed)
Martin Majestic Botts advised.

## 2014-02-05 ENCOUNTER — Telehealth: Payer: Self-pay | Admitting: Neurology

## 2014-02-05 NOTE — Telephone Encounter (Signed)
Pt cancelled 02/11/14 np appt w/ Dr. Delice Lesch due to scheduling conflict. Pt will call back to r/s / Sherri S.  @ [facility]  Copy forwarded to referring provider, Iran Planas, PA-C via [facility] telephone encounter

## 2014-02-08 DIAGNOSIS — H6123 Impacted cerumen, bilateral: Secondary | ICD-10-CM | POA: Diagnosis not present

## 2014-02-11 ENCOUNTER — Ambulatory Visit: Payer: Medicare Other | Admitting: Neurology

## 2014-02-20 DIAGNOSIS — M109 Gout, unspecified: Secondary | ICD-10-CM | POA: Diagnosis not present

## 2014-02-20 DIAGNOSIS — R1084 Generalized abdominal pain: Secondary | ICD-10-CM | POA: Diagnosis not present

## 2014-02-27 DIAGNOSIS — K6389 Other specified diseases of intestine: Secondary | ICD-10-CM | POA: Diagnosis not present

## 2014-02-27 DIAGNOSIS — R1011 Right upper quadrant pain: Secondary | ICD-10-CM | POA: Diagnosis not present

## 2014-03-04 ENCOUNTER — Observation Stay (HOSPITAL_COMMUNITY)
Admission: EM | Admit: 2014-03-04 | Discharge: 2014-03-06 | Disposition: A | Discharge status: Home / Self Care | Payer: Medicare Other | Attending: Internal Medicine | Admitting: Internal Medicine

## 2014-03-04 ENCOUNTER — Encounter (HOSPITAL_COMMUNITY): Payer: Self-pay

## 2014-03-04 ENCOUNTER — Emergency Department (HOSPITAL_COMMUNITY): Payer: Medicare Other

## 2014-03-04 DIAGNOSIS — E873 Alkalosis: Secondary | ICD-10-CM | POA: Insufficient documentation

## 2014-03-04 DIAGNOSIS — R634 Abnormal weight loss: Secondary | ICD-10-CM

## 2014-03-04 DIAGNOSIS — N400 Enlarged prostate without lower urinary tract symptoms: Secondary | ICD-10-CM | POA: Diagnosis not present

## 2014-03-04 DIAGNOSIS — Z91041 Radiographic dye allergy status: Secondary | ICD-10-CM | POA: Diagnosis not present

## 2014-03-04 DIAGNOSIS — R109 Unspecified abdominal pain: Secondary | ICD-10-CM | POA: Diagnosis present

## 2014-03-04 DIAGNOSIS — Z882 Allergy status to sulfonamides status: Secondary | ICD-10-CM | POA: Insufficient documentation

## 2014-03-04 DIAGNOSIS — I10 Essential (primary) hypertension: Secondary | ICD-10-CM | POA: Insufficient documentation

## 2014-03-04 DIAGNOSIS — R262 Difficulty in walking, not elsewhere classified: Secondary | ICD-10-CM | POA: Insufficient documentation

## 2014-03-04 DIAGNOSIS — T502X5A Adverse effect of carbonic-anhydrase inhibitors, benzothiadiazides and other diuretics, initial encounter: Secondary | ICD-10-CM | POA: Diagnosis not present

## 2014-03-04 DIAGNOSIS — Z79899 Other long term (current) drug therapy: Secondary | ICD-10-CM | POA: Diagnosis not present

## 2014-03-04 DIAGNOSIS — M109 Gout, unspecified: Secondary | ICD-10-CM | POA: Insufficient documentation

## 2014-03-04 DIAGNOSIS — R0781 Pleurodynia: Secondary | ICD-10-CM | POA: Diagnosis not present

## 2014-03-04 DIAGNOSIS — K5901 Slow transit constipation: Secondary | ICD-10-CM

## 2014-03-04 DIAGNOSIS — K5900 Constipation, unspecified: Secondary | ICD-10-CM | POA: Diagnosis not present

## 2014-03-04 DIAGNOSIS — R1031 Right lower quadrant pain: Secondary | ICD-10-CM | POA: Diagnosis not present

## 2014-03-04 DIAGNOSIS — E876 Hypokalemia: Secondary | ICD-10-CM

## 2014-03-04 DIAGNOSIS — R1084 Generalized abdominal pain: Secondary | ICD-10-CM | POA: Diagnosis not present

## 2014-03-04 DIAGNOSIS — E43 Unspecified severe protein-calorie malnutrition: Secondary | ICD-10-CM | POA: Diagnosis not present

## 2014-03-04 DIAGNOSIS — E039 Hypothyroidism, unspecified: Secondary | ICD-10-CM | POA: Diagnosis not present

## 2014-03-04 DIAGNOSIS — Z885 Allergy status to narcotic agent status: Secondary | ICD-10-CM | POA: Diagnosis not present

## 2014-03-04 DIAGNOSIS — R600 Localized edema: Secondary | ICD-10-CM

## 2014-03-04 DIAGNOSIS — Z87891 Personal history of nicotine dependence: Secondary | ICD-10-CM | POA: Diagnosis not present

## 2014-03-04 DIAGNOSIS — R072 Precordial pain: Secondary | ICD-10-CM | POA: Diagnosis not present

## 2014-03-04 DIAGNOSIS — Z888 Allergy status to other drugs, medicaments and biological substances status: Secondary | ICD-10-CM | POA: Insufficient documentation

## 2014-03-04 LAB — URINALYSIS, ROUTINE W REFLEX MICROSCOPIC
BILIRUBIN URINE: NEGATIVE
GLUCOSE, UA: NEGATIVE mg/dL
HGB URINE DIPSTICK: NEGATIVE
Ketones, ur: NEGATIVE mg/dL
Leukocytes, UA: NEGATIVE
Nitrite: NEGATIVE
PH: 6.5 (ref 5.0–8.0)
Protein, ur: NEGATIVE mg/dL
SPECIFIC GRAVITY, URINE: 1.005 (ref 1.005–1.030)
UROBILINOGEN UA: 0.2 mg/dL (ref 0.0–1.0)

## 2014-03-04 LAB — CBC WITH DIFFERENTIAL/PLATELET
Basophils Absolute: 0 10*3/uL (ref 0.0–0.1)
Basophils Relative: 0 % (ref 0–1)
EOS ABS: 0 10*3/uL (ref 0.0–0.7)
EOS PCT: 1 % (ref 0–5)
HEMATOCRIT: 37.5 % — AB (ref 39.0–52.0)
HEMOGLOBIN: 13.1 g/dL (ref 13.0–17.0)
Lymphocytes Relative: 14 % (ref 12–46)
Lymphs Abs: 0.9 10*3/uL (ref 0.7–4.0)
MCH: 31.5 pg (ref 26.0–34.0)
MCHC: 34.9 g/dL (ref 30.0–36.0)
MCV: 90.1 fL (ref 78.0–100.0)
MONO ABS: 0.8 10*3/uL (ref 0.1–1.0)
MONOS PCT: 12 % (ref 3–12)
Neutro Abs: 4.7 10*3/uL (ref 1.7–7.7)
Neutrophils Relative %: 73 % (ref 43–77)
Platelets: 380 10*3/uL (ref 150–400)
RBC: 4.16 MIL/uL — AB (ref 4.22–5.81)
RDW: 12.1 % (ref 11.5–15.5)
WBC: 6.5 10*3/uL (ref 4.0–10.5)

## 2014-03-04 LAB — COMPREHENSIVE METABOLIC PANEL
ALK PHOS: 81 U/L (ref 39–117)
ALT: 9 U/L (ref 0–53)
AST: 16 U/L (ref 0–37)
Albumin: 3.4 g/dL — ABNORMAL LOW (ref 3.5–5.2)
Anion gap: 13 (ref 5–15)
BUN: 19 mg/dL (ref 6–23)
CALCIUM: 9.7 mg/dL (ref 8.4–10.5)
CO2: 39 mEq/L — ABNORMAL HIGH (ref 19–32)
Chloride: 81 mEq/L — ABNORMAL LOW (ref 96–112)
Creatinine, Ser: 1.26 mg/dL (ref 0.50–1.35)
GFR, EST AFRICAN AMERICAN: 61 mL/min — AB (ref >90)
GFR, EST NON AFRICAN AMERICAN: 52 mL/min — AB (ref >90)
GLUCOSE: 97 mg/dL (ref 70–99)
Potassium: 2.3 mEq/L — CL (ref 3.7–5.3)
Sodium: 133 mEq/L — ABNORMAL LOW (ref 137–147)
TOTAL PROTEIN: 6.9 g/dL (ref 6.0–8.3)
Total Bilirubin: 0.4 mg/dL (ref 0.3–1.2)

## 2014-03-04 LAB — LIPASE, BLOOD: Lipase: 22 U/L (ref 11–59)

## 2014-03-04 LAB — MAGNESIUM: Magnesium: 1.8 mg/dL (ref 1.5–2.5)

## 2014-03-04 LAB — TSH: TSH: 0.668 u[IU]/mL (ref 0.350–4.500)

## 2014-03-04 MED ORDER — POTASSIUM CHLORIDE 20 MEQ/15ML (10%) PO SOLN
40.0000 meq | Freq: Once | ORAL | Status: DC
  Filled 2014-03-04: qty 30

## 2014-03-04 MED ORDER — VITAMIN D3 25 MCG (1000 UNIT) PO TABS
1000.0000 [IU] | ORAL_TABLET | Freq: Every morning | ORAL | Status: DC
  Administered 2014-03-05 – 2014-03-06 (×2): 1000 [IU] via ORAL
  Filled 2014-03-04 (×2): qty 1

## 2014-03-04 MED ORDER — THYROID 30 MG PO TABS
15.0000 mg | ORAL_TABLET | Freq: Every day | ORAL | Status: DC
Start: 2014-03-05 — End: 2014-03-06
  Administered 2014-03-06: 15 mg via ORAL
  Filled 2014-03-04 (×4): qty 1

## 2014-03-04 MED ORDER — SODIUM CHLORIDE 0.9 % IV SOLN: INTRAVENOUS | Status: AC

## 2014-03-04 MED ORDER — ONDANSETRON HCL 4 MG PO TABS: 4.0000 mg | ORAL_TABLET | Freq: Four times a day (QID) | ORAL | Status: DC | PRN

## 2014-03-04 MED ORDER — VITAMIN C 500 MG PO TABS
500.0000 mg | ORAL_TABLET | Freq: Every morning | ORAL | Status: DC
  Administered 2014-03-05 – 2014-03-06 (×2): 500 mg via ORAL
  Filled 2014-03-04 (×2): qty 1

## 2014-03-04 MED ORDER — POTASSIUM CHLORIDE 10 MEQ/100ML IV SOLN
10.0000 meq | INTRAVENOUS | Status: AC
  Administered 2014-03-04 (×3): 10 meq via INTRAVENOUS
  Filled 2014-03-04 (×3): qty 100

## 2014-03-04 MED ORDER — ACETAMINOPHEN 325 MG PO TABS: 650.0000 mg | ORAL_TABLET | Freq: Four times a day (QID) | ORAL | Status: DC | PRN

## 2014-03-04 MED ORDER — BISACODYL 10 MG RE SUPP: 10.0000 mg | Freq: Every day | RECTAL | Status: DC | PRN

## 2014-03-04 MED ORDER — METOCLOPRAMIDE HCL 5 MG PO TABS
5.0000 mg | ORAL_TABLET | Freq: Three times a day (TID) | ORAL | Status: DC
  Administered 2014-03-05 – 2014-03-06 (×4): 5 mg via ORAL
  Filled 2014-03-04 (×7): qty 1

## 2014-03-04 MED ORDER — THYROID 30 MG PO TABS
30.0000 mg | ORAL_TABLET | Freq: Every day | ORAL | Status: DC
  Administered 2014-03-05 – 2014-03-06 (×2): 30 mg via ORAL
  Filled 2014-03-04 (×3): qty 1

## 2014-03-04 MED ORDER — ENOXAPARIN SODIUM 40 MG/0.4ML ~~LOC~~ SOLN
40.0000 mg | SUBCUTANEOUS | Status: DC
  Administered 2014-03-04 – 2014-03-05 (×2): 40 mg via SUBCUTANEOUS
  Filled 2014-03-04 (×2): qty 0.4

## 2014-03-04 MED ORDER — POLYVINYL ALCOHOL 1.4 % OP SOLN
1.0000 [drp] | Freq: Every day | OPHTHALMIC | Status: DC | PRN
  Filled 2014-03-04: qty 15

## 2014-03-04 MED ORDER — ONDANSETRON HCL 4 MG/2ML IJ SOLN: 4.0000 mg | Freq: Four times a day (QID) | INTRAMUSCULAR | Status: DC | PRN

## 2014-03-04 MED ORDER — SODIUM CHLORIDE 0.9 % IV SOLN
Freq: Once | INTRAVENOUS | Status: AC
  Administered 2014-03-04: 17:00:00 via INTRAVENOUS

## 2014-03-04 MED ORDER — SODIUM CHLORIDE 0.9 % IV BOLUS (SEPSIS)
500.0000 mL | Freq: Once | INTRAVENOUS | Status: AC
  Administered 2014-03-04: 500 mL via INTRAVENOUS

## 2014-03-04 MED ORDER — ALUM & MAG HYDROXIDE-SIMETH 200-200-20 MG/5ML PO SUSP: 30.0000 mL | Freq: Four times a day (QID) | ORAL | Status: DC | PRN

## 2014-03-04 MED ORDER — ACETAMINOPHEN 650 MG RE SUPP: 650.0000 mg | Freq: Four times a day (QID) | RECTAL | Status: DC | PRN

## 2014-03-04 MED ORDER — SODIUM CHLORIDE 0.9 % IJ SOLN
3.0000 mL | Freq: Two times a day (BID) | INTRAMUSCULAR | Status: DC
  Administered 2014-03-05: 3 mL via INTRAVENOUS

## 2014-03-04 MED ORDER — POTASSIUM CHLORIDE IN NACL 40-0.9 MEQ/L-% IV SOLN
INTRAVENOUS | Status: DC
  Administered 2014-03-04 – 2014-03-05 (×2): 100 mL/h via INTRAVENOUS
  Filled 2014-03-04 (×3): qty 1000

## 2014-03-04 MED ORDER — POTASSIUM CHLORIDE CRYS ER 20 MEQ PO TBCR
40.0000 meq | EXTENDED_RELEASE_TABLET | Freq: Once | ORAL | Status: AC
  Administered 2014-03-04: 40 meq via ORAL
  Filled 2014-03-04: qty 2

## 2014-03-04 NOTE — ED Notes (Signed)
R.N Zenia Resides notified of bed and 4th floor given info and timer started...klj

## 2014-03-04 NOTE — ED Notes (Addendum)
Pt c/o generalized abdominal pain and constipation/dysuria x 2 months and L side ribcage pain x 3 days.  Pain score 3/10, increasing w/ movement.  Pt had a CT on November 4th which resulted possible early appendicitis and possible cystitis.  Hx of BPH.  Pt reports "bowel issues" since hernia surgery in December 2013.  Pt has been seen by a GI MD and a Urologist.

## 2014-03-04 NOTE — H&P (Signed)
History and Physical:    Brent Morrison OIZ:[id number] DOB: 1933/03/11 DOA: 03/04/2014  Referring physician: Dr. Christ Kick PCP: Iran Planas, PA-C  GI: Dr. Anson Fret  Chief Complaint: Abdominal pain  History of Present Illness:   Brent Morrison is an 80 y.o. male who presents with a chief complaints of a  6 month history of constipation, progressive weight loss, and abdominal pain.  He was evaluated by Dr. Vira Blanco on 02/20/14 for this abdominal pain, and a CT was ordered.  He was also given a prescription for magnesium citrate.  His constipation was temporarily improved after taking this.  The CT was done 02/27/14 which showed nonspecific borderline mild appendiceal wall thickening without significant appendiceal distention associated with minimal periappendiceal stranding. The combination of findings may indicate early changes of acute appendicitis; borderline mild wall thickening of the terminal ileum without appreciable significant bowel obstruction. This may indicate ileitis; Mild bladder wall thickening. This may represent some degree of chronic outlet obstruction, although cystitis could have a similar appearance and a 2.4 cm right renal hypodensity, suggestive of a cyst, and previous renal ultrasound 01/08/2013 has shown this to be a benign cyst. He was told that the findings were non-specific and was advised to come to the hospital for fevers, worsening abdominal pain.  The patient continued to have abdominal pain, and presented to an [facility] who "did nothing" so he ultimately came to the ER where he was found to be hypokalemic.  The patient denies nausea, vomiting, fever, chills, loose stools other than today.  The patient says his symptoms began after his hernia repairs, and he has had multiple evaluations including a colonoscopy, a cystoscopy to figure out the source of the problems.  He also endorses a 19 pound weight loss over the past couple of months.   ROS:   Constitutional: No  fever, no chills;  Appetite normal; + 19 lb weight loss in 2 months, no weight gain, no significant fatigue.  HEENT: No blurry vision, no diplopia, no pharyngitis, no dysphagia CV: No chest pain, no palpitations, no PND, no orthopnea, no edema.  Resp: No SOB, no cough, no pleuritic pain. GI: No nausea, no vomiting, no diarrhea, no melena, no hematochezia, + constipation, + abdominal pain.  GU: No dysuria, no hematuria, no frequency, no urgency, +nocuria with 2 episodes at night. MSK: no myalgias, no arthralgias.  Neuro:  No headache, no focal neurological deficits, no history of seizures, +gait imbalanced.  Psych: No depression, no anxiety.  Endo: No heat intolerance, no cold intolerance, no polyuria, no polydipsia  Skin: No rashes, no skin lesions.  Heme: No easy bruising.  Travel history: No recent travel.   Past Medical History:   Past Medical History  Diagnosis Date  . Hypothyroidism   . Gout   . BPH (benign prostatic hyperplasia) 03-20-12    difficult to void after surgery( if catherterized, desires to stay overnight).  . Allergy     occ. dry cough  . 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 Complication of anesthesia     "difficulty with urination after anesthesia"  . Bronchitis     "had it when I was a little boy; I out grew that" (03/29/2012)  . Hypertension     pcp  [place]   dr Lilla Shook    Past Surgical History:   Past Surgical History  Procedure Laterality Date  . Eye surgery    . Inguinal hernia  repair  03/29/2012    left w/mesh (03/29/2012)  . Cataract extraction w/ intraocular lens  implant, bilateral  2001  . Elbow fracture surgery  ~ 1954    "left" (03/29/2012)  . Retinal detachment surgery  1994, 2001    "both eyes" (03/29/2012)  . Inguinal hernia repair  03/29/2012    Procedure: HERNIA REPAIR INGUINAL ADULT;  Surgeon: Harl Bowie, MD;  Location: [place];  Service: General;  Laterality: Left;  .  Insertion of mesh  03/29/2012    Procedure: INSERTION OF MESH;  Surgeon: Harl Bowie, MD;  Location: [place];  Service: General;  Laterality: Left;  . Transurethral resection of prostate  02/13/13  . Inguinal hernia repair Right 06/05/2013    Procedure: HERNIA REPAIR INGUINAL ADULT;  Surgeon: Harl Bowie, MD;  Location: [place];  Service: General;  Laterality: Right;  . Insertion of mesh Right 06/05/2013    Procedure: INSERTION OF MESH;  Surgeon: Harl Bowie, MD;  Location: [place];  Service: General;  Laterality: Right;    Social History:   History   Social History  . Marital Status: Single    Spouse Name: N/A    Number of Children: 0  . Years of Education: N/A   Occupational History  . Rents and manages properties    Social History Main Topics  . Smoking status: Former Smoker -- 1.00 packs/day for 10 years    Types: Cigarettes    Quit date: 04/26/1961  . Smokeless tobacco: Never Used  . Alcohol Use: No  . Drug Use: No  . Sexual Activity: Yes   Other Topics Concern  . Not on file   Social History Narrative   Lives alone.  Single.  No children.  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    Family history:   Family History  Problem Relation Age of Onset  . Heart disease Mother     >58  . Heart disease Father     >65  . Diabetes Sister     Allergies   Indomethacin; Ivp dye; Other; Bumex; Cheese cheddar type flavor nat; Oxycontin; and Sulfa antibiotics  Current Medications:   Prior to Admission medications   Medication Sig Start Date End Date Taking? Authorizing Provider  cholecalciferol (VITAMIN D) 1000 UNITS tablet Take 1,000 Units by mouth every morning.    Yes Historical Provider, MD  docusate sodium (COLACE) 100 MG capsule Take 100 mg by mouth daily as needed (For constipation.).  02/20/14  Yes Historical Provider, MD  OVER THE COUNTER MEDICATION Take 2,500 mcg by mouth every morning.   Yes Historical Provider, MD  polyethylene glycol  (MIRALAX / GLYCOLAX) packet Take 17 g by mouth daily as needed (For constipation.).    Yes Historical Provider, MD  polyvinyl alcohol (ARTIFICIAL TEARS) 1.4 % ophthalmic solution Place 1 drop into both eyes daily as needed for dry eyes.  04/14/10  Yes Historical Provider, MD  potassium chloride (K-DUR) 10 MEQ tablet Take 1 tablet (10 mEq total) by mouth daily. Patient taking differently: Take 10 mEq by mouth daily as needed (For potassium loss.).  01/30/14  Yes Jade L Breeback, PA-C  saw palmetto 160 MG capsule Take 160 mg by mouth every morning.    Yes Historical Provider, MD  thyroid (ARMOUR) 15 MG tablet Take 1 tablet (15 mg total) by mouth daily before breakfast. Takes with 30 mg to equal 45 mg 09/19/13  Yes Jade L Breeback, PA-C  thyroid (ARMOUR) 30 MG tablet  Take 1 tablet (30 mg total) by mouth daily before breakfast. Takes with 15 mg to equal 45 mg 09/19/13  Yes Jade L Breeback, PA-C  torsemide (DEMADEX) 20 MG tablet Take one tablet and increase every 2 days until fluid on extremities resolves or reaches 5 tablets daily. Patient taking differently: Take 20-100 mg by mouth See admin instructions. He takes one tablet and increase every 2 days until fluid on extremities resolves or reaches 5 tablets daily. 12/21/13  Yes Jade L Breeback, PA-C  vitamin C (ASCORBIC ACID) 500 MG tablet Take 500 mg by mouth every morning.    Yes Historical Provider, MD  AMBULATORY NON FORMULARY MEDICATION Compression stocking, knee high 20-30 of pressure, bilateral legs. 01/18/14   Donella Stade, PA-C    Physical Exam:   Filed Vitals:   03/04/14 1800 03/04/14 1830 03/04/14 1845 03/04/14 1915  BP: 117/69   127/81  Pulse: 72 72 73 75  Temp:    97.9 F (36.6 C)  TempSrc:    Oral  Resp:  14 12 18   SpO2: 95% 98% 98% 100%     Physical Exam: Blood pressure 127/81, pulse 75, temperature 97.9 F (36.6 C), temperature source Oral, resp. rate 18, SpO2 100 %. Gen: No acute distress. Head: Normocephalic,  atraumatic. Eyes: PERRL, EOMI, sclerae nonicteric. Mouth: Oropharynx clear with moist mucous membranes. Neck: Supple, no thyromegaly, no lymphadenopathy, no jugular venous distention. Chest: Lungs clear to auscultation bilaterally. CV: Heart sounds are regular. No murmurs, rubs, or gallops. Abdomen: Soft, nontender, nondistended with normal active bowel sounds. Hernia incisions well healed with no subcutaneous masses. Extremities: Extremities with trace edema. Skin: Warm and dry. Neuro: Alert and oriented times 3; cranial nerves II through XII grossly intact. Psych: Mood and affect anxious.   Data Review:    Labs: Basic Metabolic Panel:  Recent Labs Lab 03/04/14 1547 03/04/14 1809  NA 133*  --   K 2.3*  --   CL 81*  --   CO2 39*  --   GLUCOSE 97  --   BUN 19  --   CREATININE 1.26  --   CALCIUM 9.7  --   MG  --  1.8   Liver Function Tests:  Recent Labs Lab 03/04/14 1547  AST 16  ALT 9  ALKPHOS 81  BILITOT 0.4  PROT 6.9  ALBUMIN 3.4*    Recent Labs Lab 03/04/14 1547  LIPASE 22   CBC:  Recent Labs Lab 03/04/14 1547  WBC 6.5  NEUTROABS 4.7  HGB 13.1  HCT 37.5*  MCV 90.1  PLT 380    Radiographic Studies: Ct Abdomen Pelvis Wo Contrast  03/04/2014   CLINICAL DATA:  Initial encounter for all generalized abdominal pain with constipation dysuria for about 2 months. Left-sided rib pain for 3 days.  EXAM: CT ABDOMEN AND PELVIS WITHOUT CONTRAST  TECHNIQUE: Multidetector CT imaging of the abdomen and pelvis was performed following the standard protocol without IV contrast.  COMPARISON:  None.  FINDINGS: Lower chest:  Unremarkable.  Hepatobiliary: No focal abnormality in the liver on this study without intravenous contrast. No evidence for hepatomegaly. Gallbladder is decompressed. No intrahepatic or extrahepatic biliary dilation.  Pancreas: No focal mass lesion. No dilatation of the main duct. No intraparenchymal cyst. No peripancreatic edema.  Spleen: No  splenomegaly. No focal mass lesion.  Adrenals/Urinary Tract: No adrenal nodule or mass. 2.5 cm water density lesion in the lower pole of the right kidney is likely a cyst. No stones are seen  in either kidney. No ureteral or bladder stones. No secondary changes in either kidney or ureter. Bladder is distended but otherwise unremarkable.  Stomach/Bowel: Stomach is nondistended. No gastric wall thickening. No evidence of outlet obstruction. Duodenum is normally positioned as is the ligament of Treitz. No small bowel wall thickening. No small bowel dilatation. Terminal ileum is normal. The appendix is normal. No gross colonic mass. No colonic wall thickening. No substantial diverticular change.  Vascular/Lymphatic: Atherosclerotic calcification is noted in the wall of the abdominal aorta without aneurysm. No gastrohepatic or hepatoduodenal ligament lymphadenopathy. No retroperitoneal lymphadenopathy. No pelvic sidewall lymphadenopathy.  Reproductive: Probable TURP defect in the central prostate gland. Seminal vesicles are unremarkable.  Other: No intraperitoneal free fluid.  Musculoskeletal: Bone windows reveal no worrisome lytic or sclerotic osseous lesions.  IMPRESSION: No acute findings in the abdomen or pelvis. Specifically, no findings to explain the patient's history of generalized abdominal pain.   Electronically Signed   By: Misty Stanley M.D.   On: 03/04/2014 17:08   Dg Chest 2 View  03/04/2014   CLINICAL DATA:  Left mid to lower axillary rib pain since Friday night. Symptoms are getting worse. No known injury. History of smoking.  EXAM: CHEST  2 VIEW  COMPARISON:  04/06/2013  FINDINGS: Lungs are mildly hyperinflated. Heart size is normal. Lungs are clear. No acute displaced fractures or pneumothorax identified. Prominent nipple shadows bilaterally.  IMPRESSION: No evidence for acute  abnormality.   Electronically Signed   By: Shon Hale M.D.   On: 03/04/2014 16:29    EKG: Independently reviewed. NSR at  74 bpm.   Assessment/Plan:   Principal Problem:   Diuretic-induced hypokalemia  The patient is on very high doses of diuretic therapy for uncertain reasons. The dosage seems excessive for simple treatment of edema.  He takes up to 100 mg of Demadex daily.  Hold Demadex.  Replete potassium through IV and oral routes.  Monitor on telemetry until potassium repleted.  Magnesium checked and found to be normal.  Use of laxatives may be contributory. Hold all laxatives.  Active Problems:   Essential hypertension, benign  Hold Demadex and monitor blood pressure.    Constipation  Not on opiates or other medications known to cause constipation.  Hold MiraLAX given electrolyte problems.  Placed on Reglan with when necessary to Dulcolax suppositories.    Hypothyroid  Check TSH given complaints of chronic constipation to ensure he is appropriately replaced.    Generalized abdominal pain  No evidence of bowel obstruction, enteritis, appendicitis, or other abnormalities that were noted on outside CT scan.  Abdominal exam benign.  Likely related to constipation.    Weight loss  Dietitian consultation requested.  No evidence of malignancy on CT scan.    Metabolic alkalosis  Suspect contraction alkalosis from diuretics and laxatives.  Hold diuretics and laxatives and gently hydrate.    DVT prophylaxis  Lovenox ordered.  Code Status: Full. Family Communication: Thora Lance (sister) [phone]; Gwendel Hanson (friend) [phone] at bedside. Disposition Plan: Home when stable.  Time spent: 1 hour.  Brent Morrison [facility] Pager [phone] Cell: [phone]   If 7PM-7AM, please contact night-coverage [email or web address] Password [email or web address] 03/04/2014, 7:18 PM

## 2014-03-04 NOTE — ED Provider Notes (Signed)
CSN: [id number]     Arrival date & time 03/04/14  1414 History   First MD Initiated Contact with Patient 03/04/14 1508     Chief Complaint  Patient presents with  . Abdominal Pain  . Constipation  . Flank Pain     (Consider location/radiation/quality/duration/timing/severity/associated sxs/prior Treatment) Patient is a 80 y.o. male presenting with abdominal pain, constipation, and flank pain. The history is provided by the patient and a friend.  Abdominal Pain Associated symptoms: constipation   Constipation Associated symptoms: abdominal pain   Flank Pain Associated symptoms include abdominal pain.    He has been having weight loss, decreased stooling and abdominal pain for 2 months. He had a CT Abdomen (noncontrast) 4 days ago that showed possible appendicitis. No intervention after CT scan. Decreased appitite for several weeks, but he is able to eat. No fever/chills, N/V, cough or back pain. Left chest wall pain, increased with mvt. For 2 days, without trauma. Taking usual meds. , without relief. Had colonoscopy, "normal", 3 months ago. There are no other known modifying factors.  Past Medical History  Diagnosis Date  . Hypothyroidism   . Gout   . BPH (benign prostatic hyperplasia) 03-20-12    difficult to void after surgery( if catherterized, desires to stay overnight).  . Allergy     occ. dry cough  . 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 Complication of anesthesia     "difficulty with urination after anesthesia"  . Bronchitis     "had it when I was a little boy; I out grew that" (03/29/2012)  . Hypertension     pcp  [place]   dr Lilla Shook   Past Surgical History  Procedure Laterality Date  . Eye surgery    . Inguinal hernia repair  03/29/2012    left w/mesh (03/29/2012)  . Cataract extraction w/ intraocular lens  implant, bilateral  2001  . Elbow fracture surgery  ~ 1954    "left" (03/29/2012)  .  Retinal detachment surgery  1994, 2001    "both eyes" (03/29/2012)  . Inguinal hernia repair  03/29/2012    Procedure: HERNIA REPAIR INGUINAL ADULT;  Surgeon: Harl Bowie, MD;  Location: [place];  Service: General;  Laterality: Left;  . Insertion of mesh  03/29/2012    Procedure: INSERTION OF MESH;  Surgeon: Harl Bowie, MD;  Location: [place];  Service: General;  Laterality: Left;  . Prostrate    . Inguinal hernia repair Right 06/05/2013    Procedure: HERNIA REPAIR INGUINAL ADULT;  Surgeon: Harl Bowie, MD;  Location: [place];  Service: General;  Laterality: Right;  . Insertion of mesh Right 06/05/2013    Procedure: INSERTION OF MESH;  Surgeon: Harl Bowie, MD;  Location: [place] [facility];  Service: General;  Laterality: Right;   Family History  Problem Relation Age of Onset  . Heart disease Mother     >13  . Heart disease Father     >65  . Diabetes Sister    History  Substance Use Topics  . Smoking status: Former Smoker -- 1.00 packs/day for 10 years    Types: Cigarettes    Quit date: 04/26/1961  . Smokeless tobacco: Never Used  . Alcohol Use: No    Review of Systems  Gastrointestinal: Positive for abdominal pain and constipation.  Genitourinary: Positive for flank pain.  All other systems reviewed and are negative.     Allergies  Indomethacin;  Ivp dye; Other; Bumex; Cheese cheddar type flavor nat; Oxycontin; and Sulfa antibiotics  Home Medications   Prior to Admission medications   Medication Sig Start Date End Date Taking? Authorizing Provider  cholecalciferol (VITAMIN D) 1000 UNITS tablet Take 1,000 Units by mouth every morning.    Yes Historical Provider, MD  docusate sodium (COLACE) 100 MG capsule Take 100 mg by mouth daily as needed (For constipation.).  02/20/14  Yes Historical Provider, MD  OVER THE COUNTER MEDICATION Take 2,500 mcg by mouth every morning.   Yes Historical Provider, MD  polyethylene glycol (MIRALAX / GLYCOLAX) packet Take 17 g by mouth  daily as needed (For constipation.).    Yes Historical Provider, MD  polyvinyl alcohol (ARTIFICIAL TEARS) 1.4 % ophthalmic solution Place 1 drop into both eyes daily as needed for dry eyes.  04/14/10  Yes Historical Provider, MD  potassium chloride (K-DUR) 10 MEQ tablet Take 1 tablet (10 mEq total) by mouth daily. Patient taking differently: Take 10 mEq by mouth daily as needed (For potassium loss.).  01/30/14  Yes Jade L Breeback, PA-C  saw palmetto 160 MG capsule Take 160 mg by mouth every morning.    Yes Historical Provider, MD  thyroid (ARMOUR) 15 MG tablet Take 1 tablet (15 mg total) by mouth daily before breakfast. Takes with 30 mg to equal 45 mg 09/19/13  Yes Jade L Breeback, PA-C  thyroid (ARMOUR) 30 MG tablet Take 1 tablet (30 mg total) by mouth daily before breakfast. Takes with 15 mg to equal 45 mg 09/19/13  Yes Jade L Breeback, PA-C  torsemide (DEMADEX) 20 MG tablet Take one tablet and increase every 2 days until fluid on extremities resolves or reaches 5 tablets daily. Patient taking differently: Take 20-100 mg by mouth See admin instructions. He takes one tablet and increase every 2 days until fluid on extremities resolves or reaches 5 tablets daily. 12/21/13  Yes Jade L Breeback, PA-C  vitamin C (ASCORBIC ACID) 500 MG tablet Take 500 mg by mouth every morning.    Yes Historical Provider, MD  AMBULATORY NON FORMULARY MEDICATION Compression stocking, knee high 20-30 of pressure, bilateral legs. 01/18/14   Jade L Breeback, PA-C   BP 127/62 mmHg  Pulse 75  Temp(Src) 97.6 F (36.4 C) (Oral)  Resp 16  SpO2 100% Physical Exam  Constitutional: He is oriented to person, place, and time. He appears well-developed.  Elderly, frail  HENT:  Head: Normocephalic and atraumatic.  Right Ear: External ear normal.  Left Ear: External ear normal.  Eyes: Conjunctivae and EOM are normal. Pupils are equal, round, and reactive to light.  Neck: Normal range of motion and phonation normal. Neck supple.   Cardiovascular: Normal rate, regular rhythm and normal heart sounds.   Pulmonary/Chest: Effort normal and breath sounds normal. He exhibits no tenderness (no crepitation or deformity.) and no bony tenderness.  Abdominal: Soft. He exhibits no mass. There is tenderness (RLQ, mild.). There is no rebound and no guarding.  Musculoskeletal: Normal range of motion.  Neurological: He is alert and oriented to person, place, and time. No cranial nerve deficit or sensory deficit. He exhibits normal muscle tone. Coordination normal.  Skin: Skin is warm, dry and intact.  Psychiatric: He has a normal mood and affect. His behavior is normal. Judgment and thought content normal.  Nursing note and vitals reviewed.   ED Course  Procedures (including critical care time)  Medications  potassium chloride 10 mEq in 100 mL IVPB (10 mEq Intravenous New Bag/Given  03/04/14 1822)  sodium chloride 0.9 % bolus 500 mL (0 mLs Intravenous Stopped 03/04/14 1648)  0.9 %  sodium chloride infusion ( Intravenous New Bag/Given 03/04/14 1713)  potassium chloride SA (K-DUR,KLOR-CON) CR tablet 40 mEq (40 mEq Oral Given 03/04/14 1822)    Patient Vitals for the past 24 hrs:  BP Temp Temp src Pulse Resp SpO2  03/04/14 1427 127/62 mmHg 97.6 F (36.4 C) Oral 75 16 100 %    6:22 PM Reevaluation with update and discussion. After initial assessment and treatment, an updated evaluation reveals his exam, and complaints are unchanged.  He was informed of the findings.  He wants another doctor just see him about his weight loss, and inability to have bowel movements.  I explained to him that I believe that this is due to decreased oral intake.  There is no evidence for appendicitis or other acute intra-abdominal process.  CT evaluation was limited, secondary to his inability to take IV contrast because of "swelling".  He is unable to specify which type of swelling.  He has when he is exposed to IV contrast.  He understands that he needs to be  admitted for hypokalemia.Richarda Blade   6:25 PM-Consult complete with Dr. Rockne Menghini. Patient case explained and discussed. She agrees to admit patient for further evaluation and treatment. Call ended at [place] Reviewed  CBC WITH DIFFERENTIAL - Abnormal; Notable for the following:    RBC 4.16 (*)    HCT 37.5 (*)    All other components within normal limits  COMPREHENSIVE METABOLIC PANEL - Abnormal; Notable for the following:    Sodium 133 (*)    Potassium 2.3 (*)    Chloride 81 (*)    CO2 39 (*)    Albumin 3.4 (*)    GFR calc non Af Amer 52 (*)    GFR calc Af Amer 61 (*)    All other components within normal limits  URINE CULTURE  URINALYSIS, ROUTINE W REFLEX MICROSCOPIC  LIPASE, BLOOD  MAGNESIUM    Imaging Review Ct Abdomen Pelvis Wo Contrast  03/04/2014   CLINICAL DATA:  Initial encounter for all generalized abdominal pain with constipation dysuria for about 2 months. Left-sided rib pain for 3 days.  EXAM: CT ABDOMEN AND PELVIS WITHOUT CONTRAST  TECHNIQUE: Multidetector CT imaging of the abdomen and pelvis was performed following the standard protocol without IV contrast.  COMPARISON:  None.  FINDINGS: Lower chest:  Unremarkable.  Hepatobiliary: No focal abnormality in the liver on this study without intravenous contrast. No evidence for hepatomegaly. Gallbladder is decompressed. No intrahepatic or extrahepatic biliary dilation.  Pancreas: No focal mass lesion. No dilatation of the main duct. No intraparenchymal cyst. No peripancreatic edema.  Spleen: No splenomegaly. No focal mass lesion.  Adrenals/Urinary Tract: No adrenal nodule or mass. 2.5 cm water density lesion in the lower pole of the right kidney is likely a cyst. No stones are seen in either kidney. No ureteral or bladder stones. No secondary changes in either kidney or ureter. Bladder is distended but otherwise unremarkable.  Stomach/Bowel: Stomach is nondistended. No gastric wall thickening. No evidence of  outlet obstruction. Duodenum is normally positioned as is the ligament of Treitz. No small bowel wall thickening. No small bowel dilatation. Terminal ileum is normal. The appendix is normal. No gross colonic mass. No colonic wall thickening. No substantial diverticular change.  Vascular/Lymphatic: Atherosclerotic calcification is noted in the wall of the abdominal aorta without aneurysm. No gastrohepatic  or hepatoduodenal ligament lymphadenopathy. No retroperitoneal lymphadenopathy. No pelvic sidewall lymphadenopathy.  Reproductive: Probable TURP defect in the central prostate gland. Seminal vesicles are unremarkable.  Other: No intraperitoneal free fluid.  Musculoskeletal: Bone windows reveal no worrisome lytic or sclerotic osseous lesions.  IMPRESSION: No acute findings in the abdomen or pelvis. Specifically, no findings to explain the patient's history of generalized abdominal pain.   Electronically Signed   By: Misty Stanley M.D.   On: 03/04/2014 17:08   Dg Chest 2 View  03/04/2014   CLINICAL DATA:  Left mid to lower axillary rib pain since Friday night. Symptoms are getting worse. No known injury. History of smoking.  EXAM: CHEST  2 VIEW  COMPARISON:  04/06/2013  FINDINGS: Lungs are mildly hyperinflated. Heart size is normal. Lungs are clear. No acute displaced fractures or pneumothorax identified. Prominent nipple shadows bilaterally.  IMPRESSION: No evidence for acute  abnormality.   Electronically Signed   By: Shon Hale M.D.   On: 03/04/2014 16:29     EKG Interpretation   Date/Time:  Monday March 04 2014 18:22:16 EST Ventricular Rate:  74 PR Interval:  197 QRS Duration: 99 QT Interval:  421 QTC Calculation: 467 R Axis:   43 Text Interpretation:  Sinus rhythm since last tracing no significant  change Confirmed by Gianfranco Araki  MD, Emmelyn Schmale ([id number]) on 03/04/2014 6:25:04 PM      MDM   Final diagnoses:  Abdominal pain  Hypokalemia  Weight loss    Reported weight loss with ongoing  abdominal pain for several months.  Patient fixated on lack of stooling, but there is no evidence for bowel obstruction, or acute intra-abdominal process.  Incidental hypokalemia, likely related to diuretics and I suspect poor nutrition.  No evidence for acute cardiac suppression secondary to hypokalemia.  He will require admission for observation, treatment and stabilization of his potassium level.  He may require another opinion from a different GI specialist to evaluate his ongoing abdominal complaints/  Nursing Notes Reviewed/ Care Coordinated, and agree without changes. Applicable Imaging Reviewed.  Interpretation of Laboratory Data incorporated into ED treatment  Plan: Admit    Richarda Blade, MD 03/04/14 [phone]

## 2014-03-04 NOTE — Plan of Care (Signed)
Problem: Phase I Progression Outcomes Goal: OOB as tolerated unless otherwise ordered Outcome: Completed/Met Date Met:  03/04/14 Goal: Voiding-avoid urinary catheter unless indicated Outcome: Completed/Met Date Met:  03/04/14 Goal: Hemodynamically stable Outcome: Completed/Met Date Met:  03/04/14 Goal: Other Phase I Outcomes/Goals Outcome: Completed/Met Date Met:  03/04/14

## 2014-03-05 DIAGNOSIS — M7989 Other specified soft tissue disorders: Secondary | ICD-10-CM | POA: Diagnosis not present

## 2014-03-05 DIAGNOSIS — I10 Essential (primary) hypertension: Secondary | ICD-10-CM

## 2014-03-05 DIAGNOSIS — E039 Hypothyroidism, unspecified: Secondary | ICD-10-CM | POA: Diagnosis not present

## 2014-03-05 DIAGNOSIS — E873 Alkalosis: Secondary | ICD-10-CM | POA: Diagnosis not present

## 2014-03-05 DIAGNOSIS — T502X5A Adverse effect of carbonic-anhydrase inhibitors, benzothiadiazides and other diuretics, initial encounter: Secondary | ICD-10-CM | POA: Diagnosis not present

## 2014-03-05 DIAGNOSIS — E876 Hypokalemia: Secondary | ICD-10-CM | POA: Diagnosis not present

## 2014-03-05 LAB — BASIC METABOLIC PANEL
ANION GAP: 9 (ref 5–15)
Anion gap: 9 (ref 5–15)
BUN: 13 mg/dL (ref 6–23)
BUN: 15 mg/dL (ref 6–23)
CALCIUM: 8.2 mg/dL — AB (ref 8.4–10.5)
CO2: 36 mEq/L — ABNORMAL HIGH (ref 19–32)
CO2: 31 mEq/L (ref 19–32)
Calcium: 8.4 mg/dL (ref 8.4–10.5)
Chloride: 92 mEq/L — ABNORMAL LOW (ref 96–112)
Chloride: 97 mEq/L (ref 96–112)
Creatinine, Ser: 1.15 mg/dL (ref 0.50–1.35)
Creatinine, Ser: 1.09 mg/dL (ref 0.50–1.35)
GFR calc non Af Amer: 59 mL/min — ABNORMAL LOW (ref >90)
GFR, EST AFRICAN AMERICAN: 68 mL/min — AB (ref >90)
GFR, EST AFRICAN AMERICAN: 73 mL/min — AB (ref >90)
GFR, EST NON AFRICAN AMERICAN: 63 mL/min — AB (ref >90)
GLUCOSE: 137 mg/dL — AB (ref 70–99)
Glucose, Bld: 94 mg/dL (ref 70–99)
POTASSIUM: 2.8 meq/L — AB (ref 3.7–5.3)
Potassium: 4.2 mEq/L (ref 3.7–5.3)
SODIUM: 137 meq/L (ref 137–147)
SODIUM: 137 meq/L (ref 137–147)

## 2014-03-05 LAB — MAGNESIUM: MAGNESIUM: 1.8 mg/dL (ref 1.5–2.5)

## 2014-03-05 MED ORDER — POTASSIUM CHLORIDE CRYS ER 20 MEQ PO TBCR
20.0000 meq | EXTENDED_RELEASE_TABLET | ORAL | Status: DC
  Administered 2014-03-05 (×2): 20 meq via ORAL
  Filled 2014-03-05 (×2): qty 1

## 2014-03-05 MED ORDER — POTASSIUM CHLORIDE CRYS ER 20 MEQ PO TBCR
40.0000 meq | EXTENDED_RELEASE_TABLET | ORAL | Status: AC
  Administered 2014-03-05 (×2): 40 meq via ORAL
  Filled 2014-03-05: qty 2

## 2014-03-05 MED ORDER — ENSURE COMPLETE PO LIQD
237.0000 mL | ORAL | Status: DC
  Administered 2014-03-05: 237 mL via ORAL

## 2014-03-05 NOTE — Progress Notes (Signed)
INITIAL NUTRITION ASSESSMENT  DOCUMENTATION CODES Per approved criteria  -Severe malnutrition in the context of chronic illness  Pt meets criteria for severe MALNUTRITION in the context of chronic illness as evidenced by 10.5% body weight loss in <3 months, likely PO intake < 75% estimated nutrition needs for > one month.   INTERVENTION: -Recommend Ensure Complete po Q24H, each supplement provides 350 kcal and 13 grams of protein -Encouraged intake of fluids, fiber, and physical activity to assist with bowel regularity -Will provide constipation nutrition education handout and nutrition supplement coupons -RD to continue to monitor  NUTRITION DIAGNOSIS: Unintentional weight loss related to inadequate energy intake as evidenced by 17 lb weight loss in < 3 months.   Goal: Pt to meet >/= 90% of their estimated nutrition needs    Monitor:  Total protein/energy intake, labs, weights, GI profile, labs, weight, education needs  Reason for Assessment: Consult to Assess/MST  80 y.o. male  Admitting Dx: Diuretic-induced hypokalemia  ASSESSMENT: Brent Morrison is an 80 y.o. male who presents with a chief complaints of a 6 month history of constipation, progressive weight loss, and abdominal pain  -Pt endorsed an unintentional wt loss of 17-19 lbs in past several months, previous medical records indicate a 17 lb weight loss in < 3 months (10.5% body weight loss, severe for time frame) -Reported having difficulty with regularity for past 6 months d/t recent hernia surgeries. Has been given stool softeners, but found them to be ineffective.  -Diet recall indicates pt consuming three meals daily with occassional afternoon snack. Reported consuming variety of foods. Denied significant loss of appetite; however noted that constipation and abd pain has likely decreased usual intake. Pt cooks meals for self at home. -Receiving Doculax daily PRN, had BM on 11/09. Encouraged intake of fiber foods,  fluids, and physical activity to assist in BM. Will provide nutrition education handout -Current PO intake 100% of meals, denied any abd pain post meals -Willing to consume nutrition supplement to help prevent further weight loss and for muscle maintanence  Height: Ht Readings from Last 1 Encounters:  03/04/14 5\' 10"  (1.778 m)    Weight: Wt Readings from Last 1 Encounters:  03/04/14 144 lb 13.5 oz (65.7 kg)    Ideal Body Weight: 166 lb  % Ideal Body Weight: 87%  Wt Readings from Last 10 Encounters:  03/04/14 144 lb 13.5 oz (65.7 kg)  01/18/14 151 lb (68.493 kg)  12/21/13 161 lb (73.029 kg)  09/21/13 167 lb (75.751 kg)  08/17/13 167 lb (75.751 kg)  08/06/13 164 lb (74.39 kg)  07/30/13 165 lb (74.844 kg)  07/09/13 164 lb 9.6 oz (74.662 kg)  06/05/13 163 lb (73.936 kg)  05/28/13 166 lb 1.6 oz (75.342 kg)    Usual Body Weight: 161-165  % Usual Body Weight: 89%  BMI:  Body mass index is 20.78 kg/(m^2).  Estimated Nutritional Needs: Kcal: 1950-2150 Protein: 80-90 gram Fluid: >/= 1900 ml daily  Skin: WDL  Diet Order:    EDUCATION NEEDS: -Education needs addressed   Intake/Output Summary (Last 24 hours) at 03/05/14 1110 Last data filed at 03/05/14 0700  Gross per 24 hour  Intake 2441.67 ml  Output    575 ml  Net 1866.67 ml    Last BM: 11/09   Labs:   Recent Labs Lab 03/04/14 1547 03/04/14 1809 03/05/14 0443  NA 133*  --  137  K 2.3*  --  2.8*  CL 81*  --  92*  CO2 39*  --  36*  BUN 19  --  13  CREATININE 1.26  --  1.15  CALCIUM 9.7  --  8.4  MG  --  1.8  --   GLUCOSE 97  --  94    CBG (last 3)  No results for input(s): GLUCAP in the last 72 hours.  Scheduled Meds: . sodium chloride   Intravenous STAT  . cholecalciferol  1,000 Units Oral q morning - 10a  . enoxaparin (LOVENOX) injection  40 mg Subcutaneous Q24H  . feeding supplement (ENSURE COMPLETE)  237 mL Oral Q24H  . metoCLOPramide  5 mg Oral TID AC  . potassium chloride  40 mEq Oral  Q4H  . sodium chloride  3 mL Intravenous Q12H  . thyroid  15 mg Oral QAC breakfast  . thyroid  30 mg Oral QAC breakfast  . vitamin C  500 mg Oral q morning - 10a    Continuous Infusions:   Past Medical History  Diagnosis Date  . Hypothyroidism   . Gout   . BPH (benign prostatic hyperplasia) 03-20-12    difficult to void after surgery( if catherterized, desires to stay overnight).  . Allergy     occ. dry cough  . 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 Complication of anesthesia     "difficulty with urination after anesthesia"  . Bronchitis     "had it when I was a little boy; I out grew that" (03/29/2012)  . Hypertension     pcp  [place]   dr Lilla Shook    Past Surgical History  Procedure Laterality Date  . Eye surgery    . Inguinal hernia repair  03/29/2012    left w/mesh (03/29/2012)  . Cataract extraction w/ intraocular lens  implant, bilateral  2001  . Elbow fracture surgery  ~ 1954    "left" (03/29/2012)  . Retinal detachment surgery  1994, 2001    "both eyes" (03/29/2012)  . Inguinal hernia repair  03/29/2012    Procedure: HERNIA REPAIR INGUINAL ADULT;  Surgeon: Harl Bowie, MD;  Location: [facility];  Service: General;  Laterality: Left;  . Insertion of mesh  03/29/2012    Procedure: INSERTION OF MESH;  Surgeon: Harl Bowie, MD;  Location: [place];  Service: General;  Laterality: Left;  . Transurethral resection of prostate  02/13/13  . Inguinal hernia repair Right 06/05/2013    Procedure: HERNIA REPAIR INGUINAL ADULT;  Surgeon: Harl Bowie, MD;  Location: [place];  Service: General;  Laterality: Right;  . Insertion of mesh Right 06/05/2013    Procedure: INSERTION OF MESH;  Surgeon: Harl Bowie, MD;  Location: [place];  Service: General;  Laterality: Right;    Atlee Abide MS RD LDN Clinical Dietitian IDPOE:[phone]

## 2014-03-05 NOTE — Evaluation (Signed)
Occupational Therapy Evaluation and Discharge Summary Patient Details Name: Brent Morrison MRN: [id number] DOB: 1933-02-14 Today's Date: 03/05/2014    History of Present Illness Pty is a 80 yo male admitted with c/o constipation, abdominal pain and wt loss over last 6 months.  Pt found to be hypokalemic.   Clinical Impression   Pt admitted with the above diagnosis.  Pt overall is functioning at his baseline.  Pt has baseline vision deficits and this is where he is limited here in the hospital.  Pt states he does better in own environment which makes sense when it comes to his vision. Pt is fixated on having a bowel movement and would not discuss much of anything else.  He does not want further OT and most likely is safe to return home with friends just checking in on him.  Pt had friends that take him to pay bills, to doctor appts and to grocery store since his decreased vision keeps him from driving.  Pt with no acute OT needs.    Follow Up Recommendations  No OT follow up;Supervision - Intermittent    Equipment Recommendations  None recommended by OT    Recommendations for Other Services       Precautions / Restrictions Precautions Precautions: Other (comment) (poor vision) Precaution Comments: Pt with very poor vision.  Restrictions Weight Bearing Restrictions: No      Mobility Bed Mobility Overal bed mobility: Independent             General bed mobility comments: no assist needed.  Transfers Overall transfer level: Independent Equipment used: None             General transfer comment: Pt walked in room w/o assist.    Balance Overall balance assessment: No apparent balance deficits (not formally assessed)                                          ADL Overall ADL's : Independent                                       General ADL Comments: Pt appears to be at baseline with adls. Pt had some difficulties with adls due to  vision but was able to compensate and with cues did not need assist.  He states he is more familiar with things at his home and he does need or want assist at his house in re: to OT.     Vision     Ocular Range of Motion: Within Functional Limits Tracking/Visual Pursuits: Able to track stimulus in all quads without difficulty         Additional Comments: Pt has had visual deficits for some time now.  As of today, they are unchanged with this admission.  Pt able to function in his room but does have apparent visual deficits during adls when it comes to finding items in his room.   Perception Perception Perception Tested?: No   Praxis      Pertinent Vitals/Pain Pain Assessment: 0-10 Pain Score: 2  Pain Location: abdominal pain Pain Descriptors / Indicators: Discomfort Pain Intervention(s): Monitored during session     Hand Dominance Right   Extremity/Trunk Assessment Upper Extremity Assessment Upper Extremity Assessment: Overall WFL for tasks assessed   Lower Extremity Assessment Lower Extremity Assessment:  Defer to PT evaluation   Cervical / Trunk Assessment Cervical / Trunk Assessment: Normal   Communication Communication Communication: No difficulties   Cognition Arousal/Alertness: Awake/alert Behavior During Therapy: WFL for tasks assessed/performed Overall Cognitive Status: Within Functional Limits for tasks assessed                     General Comments       Exercises       Shoulder Instructions      Home Living Family/patient expects to be discharged to:: Private residence Living Arrangements: Alone Available Help at Discharge: Friend(s);Available PRN/intermittently Type of Home: House Home Access: Stairs to enter [employer] of Steps: 3 Entrance Stairs-Rails: Right Home Layout: One level     Bathroom Shower/Tub: Walk-in shower;Tub/shower unit Shower/tub characteristics: Regulatory affairs officer: Yes   Home Equipment: None          Prior Functioning/Environment Level of Independence: Needs assistance  Gait / Transfers Assistance Needed: none ADL's / Homemaking Assistance Needed: needs assist driving due to vision.  Pt states he cleans own home and cooks.        OT Diagnosis: Generalized weakness   OT Problem List:     OT Treatment/Interventions:      OT Goals(Current goals can be found in the care plan section) Acute Rehab OT Goals Patient Stated Goal: to be able to go to the bathroom and get out of here.  OT Frequency:     Barriers to D/C:            Co-evaluation              End of Session Nurse Communication: Mobility status  Activity Tolerance: Patient tolerated treatment well Patient left: in chair;with call bell/phone within reach   Time: 1200-1221 OT Time Calculation (min): 21 min Charges:  OT General Charges $OT Visit: 1 Procedure OT Evaluation $Initial OT Evaluation Tier I: 1 Procedure OT Treatments $Self Care/Home Management : 8-22 mins G-Codes: OT G-codes **NOT FOR INPATIENT CLASS** Functional Assessment Tool Used: clinical judgement Functional Limitation: Self care Self Care Current Status (G6269): At least 1 percent but less than 20 percent impaired, limited or restricted Self Care Goal Status (S8546): At least 1 percent but less than 20 percent impaired, limited or restricted Self Care Discharge Status [phone]): At least 1 percent but less than 20 percent impaired, limited or restricted  Glenford Peers 03/05/2014, 12:33 PM  [phone]

## 2014-03-05 NOTE — Plan of Care (Signed)
Problem: Phase II Progression Outcomes Goal: IV changed to normal saline lock Outcome: Completed/Met Date Met:  03/05/14     

## 2014-03-05 NOTE — Progress Notes (Signed)
CRITICAL VALUE ALERT  Critical value received:  Potassium 2.8  Date of notification:  03/05/2014   Time of notification:  0613  Critical value read back:Yes.    Nurse who received alert:  Sabino Gasser, RN  MD notified (1st page):  Tylene Fantasia, NP  Time of first page:  0615  MD notified (2nd page):  Time of second page:  Responding MD:  Tylene Fantasia, NP  Time MD responded:  [phone]

## 2014-03-05 NOTE — Plan of Care (Signed)
Problem: Phase I Progression Outcomes Goal: Pain controlled with appropriate interventions Outcome: Completed/Met Date Met:  03/05/14

## 2014-03-05 NOTE — Evaluation (Signed)
Physical Therapy Evaluation Patient Details Name: Brent Morrison MRN: [id number] DOB: 26-Dec-1932 Today's Date: 03/05/2014   History of Present Illness  Pty is a 80 yo male admitted with c/o constipation, abdominal pain and wt loss over last 6 months.  Pt found to be hypokalemic.  Clinical Impression  On eval, pt required supervision for safety/cues regarding environment due to poor vision. Pt tolerated activity well. He did report L foot pain with ambulation (pt feels it could be due to a gout flare???). Do not anticipate any follow up PT needs at discharge.     Follow Up Recommendations No PT follow up    Equipment Recommendations  None recommended by PT    Recommendations for Other Services       Precautions / Restrictions Precautions Precaution Comments: Pt with very poor vision.  Restrictions Weight Bearing Restrictions: No      Mobility  Bed Mobility Overal bed mobility: Independent                Transfers Overall transfer level: Independent                  Ambulation/Gait Ambulation/Gait assistance: Supervision Ambulation Distance (Feet): 200 Feet Assistive device: None Gait Pattern/deviations: Step-through pattern     General Gait Details: supervision for safety/cueing about surroundings due to poor vision  Stairs Stairs: Yes Stairs assistance: Supervision Stair Management: One rail Left;Step to pattern;Forwards Number of Stairs: 4 General stair comments: supervision for safety/cueing about surroundings due to poor vision  Wheelchair Mobility    Modified Rankin (Stroke Patients Only)       Balance                                             Pertinent Vitals/Pain Pain Assessment: 0-10 Pain Score: 3  Pain Location: L foot (pt think it could be gout) Pain Intervention(s): Monitored during session    Home Living Family/patient expects to be discharged to:: Private residence Living Arrangements: Alone Available  Help at Discharge: Friend(s);Available PRN/intermittently Type of Home: House Home Access: Stairs to enter Entrance Stairs-Rails: Right   Home Layout: One level Home Equipment: None      Prior Function Level of Independence: Independent   Gait / Transfers Assistance Needed: none  ADL's / Homemaking Assistance Needed: needs assist driving due to vision.  Pt states he cleans own home and cooks.        Hand Dominance   Dominant Hand: Right    Extremity/Trunk Assessment   Upper Extremity Assessment: Defer to OT evaluation           Lower Extremity Assessment: Overall WFL for tasks assessed      Cervical / Trunk Assessment: Normal  Communication   Communication: No difficulties  Cognition Arousal/Alertness: Awake/alert Behavior During Therapy: WFL for tasks assessed/performed Overall Cognitive Status: Within Functional Limits for tasks assessed                      General Comments      Exercises        Assessment/Plan    PT Assessment Patient needs continued PT services  PT Diagnosis Difficulty walking;Acute pain   PT Problem List Decreased mobility;Pain  PT Treatment Interventions Gait training;Functional mobility training;Therapeutic activities;Therapeutic exercise;Patient/family education   PT Goals (Current goals can be found in the Care Plan section) Acute Rehab PT  Goals Patient Stated Goal: to be able to go to the bathroom and get out of here. PT Goal Formulation: With patient Time For Goal Achievement: 03/12/14 Potential to Achieve Goals: Good    Frequency Min 3X/week   Barriers to discharge        Co-evaluation               End of Session   Activity Tolerance: Patient tolerated treatment well Patient left: in bed;with call bell/phone within reach           Time: 1513-1535 PT Time Calculation (min) (ACUTE ONLY): 22 min   Charges:   PT Evaluation $Initial PT Evaluation Tier I: 1 Procedure PT Treatments $Gait  Training: 8-22 mins   PT G Codes:          Weston Anna, MPT Pager: [phone]

## 2014-03-05 NOTE — Care Management Note (Addendum)
    Page 1 of 1   03/06/2014     2:19:39 PM CARE MANAGEMENT NOTE 03/06/2014  Patient:  Brent Morrison, Brent Morrison   Account Number:  [id number]  Date Initiated:  03/05/2014  Documentation initiated by:  Dessa Phi  Subjective/Objective Assessment:   80 Y/O M ADMITTED W/DIURETIC INDUCED HYPOKALEMIA.     Action/Plan:   FROM HOME.   Anticipated DC Date:  03/06/2014   Anticipated DC Plan:  [place]  CM consult  Patient refused services      Choice offered to / List presented to:             Status of service:  Completed, signed off Medicare Important Message given?   (If response is "NO", the following Medicare IM given date fields will be blank) Date Medicare IM given:   Medicare IM given by:   Date Additional Medicare IM given:   Additional Medicare IM given by:    Discharge Disposition:  HOME/SELF CARE  Per UR Regulation:  Reviewed for med. necessity/level of care/duration of stay  If discussed at [place] of Stay Meetings, dates discussed:    Comments:  03/06/14 Juliann Pulse Hadas Jessop rn,bsn ncm [phone] [place].SPOKE TO PATIENT ABOUT HHC SERVICES.PATIENT PLESANTLY DECLINES HHC.HE STATES HE HAS A NIECE THAT IS A DOCTOR WHO HE WILL ASK TO CHECK ON HIM.HE ALSO STATES HE WILL STAY WITH ANOTHER FAMILY MEMBER @ D/C.MD NOTIFIED.NO FURTHER D/C NEEDS.  03/05/14 Maite Burlison RN,BSN NCM [phone] IVF,C DIFF PENDING,CONTACT PREC.AWAIT PT/OT RECOMMENDATIONS.

## 2014-03-05 NOTE — Progress Notes (Signed)
*  PRELIMINARY RESULTS* Vascular Ultrasound Lower extremity venous duplex has been completed.  Preliminary findings: no evidence of DVT  Landry Mellow, RDMS, RVT  03/05/2014, 10:14 AM

## 2014-03-05 NOTE — Progress Notes (Addendum)
[facility] PROGRESS NOTE  Brent Morrison YYT:[id number] DOB: May 20, 1932 DOA: 03/04/2014  PCP: Iran Planas, PA-C  Brief HPI: Brent Morrison is an 80 y.o. male who presents with a chief complaints of a 6 month history of constipation, progressive weight loss, and abdominal pain.Patient presented with abdominal pain/constipation which appears to be ongoing for a while. In the ED he was found to be hypokalemic.   Past medical history:  Past Medical History  Diagnosis Date  . Hypothyroidism   . Gout   . BPH (benign prostatic hyperplasia) 03-20-12    difficult to void after surgery( if catherterized, desires to stay overnight).  . Allergy     occ. dry cough  . 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 Complication of anesthesia     "difficulty with urination after anesthesia"  . Bronchitis     "had it when I was a little boy; I out grew that" (03/29/2012)  . Hypertension     pcp  [place]   dr Lilla Shook    Consultants: None  Procedures: None  Antibiotics: None  Subjective: Patient is concerned about his constipation. Denies abdominal pain currently. He mentions left sided rib pain only when he turns his body in a certain way. Denies shortness of breath.   Objective: Vital Signs  Filed Vitals:   03/04/14 1845 03/04/14 1915 03/04/14 1940 03/05/14 0613  BP:  127/81 120/70 108/59  Pulse: 73 75 75 71  Temp:  97.9 F (36.6 C) 98.4 F (36.9 C) 98 F (36.7 C)  TempSrc:  Oral Oral Oral  Resp: 12 18 20 20   Height:   5\' 10"  (1.778 m)   Weight:   65.7 kg (144 lb 13.5 oz)   SpO2: 98% 100% 100% 94%    Intake/Output Summary (Last 24 hours) at 03/05/14 1116 Last data filed at 03/05/14 0700  Gross per 24 hour  Intake 2441.67 ml  Output    575 ml  Net 1866.67 ml   Filed Weights   03/04/14 1940  Weight: 65.7 kg (144 lb 13.5 oz)    General appearance: alert, cooperative, appears stated age and no  distress Head: Normocephalic, without obvious abnormality, atraumatic Resp: clear to auscultation bilaterally Cardio: regular rate and rhythm, S1, S2 normal, no murmur, click, rub or gallop. No tenderness over left rib cage area. GI: soft, non-tender; bowel sounds normal; no masses,  no organomegaly Extremities: mild edema bil LE. No erythema. Neurologic: Grossly normal  Lab Results:  Basic Metabolic Panel:  Recent Labs Lab 03/04/14 1547 03/04/14 1809 03/05/14 0443  NA 133*  --  137  K 2.3*  --  2.8*  CL 81*  --  92*  CO2 39*  --  36*  GLUCOSE 97  --  94  BUN 19  --  13  CREATININE 1.26  --  1.15  CALCIUM 9.7  --  8.4  MG  --  1.8  --    Liver Function Tests:  Recent Labs Lab 03/04/14 1547  AST 16  ALT 9  ALKPHOS 81  BILITOT 0.4  PROT 6.9  ALBUMIN 3.4*    Recent Labs Lab 03/04/14 1547  LIPASE 22   CBC:  Recent Labs Lab 03/04/14 1547  WBC 6.5  NEUTROABS 4.7  HGB 13.1  HCT 37.5*  MCV 90.1  PLT 380    Studies/Results: Ct Abdomen Pelvis Wo Contrast  03/04/2014   CLINICAL DATA:  Initial encounter for all generalized  abdominal pain with constipation dysuria for about 2 months. Left-sided rib pain for 3 days.  EXAM: CT ABDOMEN AND PELVIS WITHOUT CONTRAST  TECHNIQUE: Multidetector CT imaging of the abdomen and pelvis was performed following the standard protocol without IV contrast.  COMPARISON:  None.  FINDINGS: Lower chest:  Unremarkable.  Hepatobiliary: No focal abnormality in the liver on this study without intravenous contrast. No evidence for hepatomegaly. Gallbladder is decompressed. No intrahepatic or extrahepatic biliary dilation.  Pancreas: No focal mass lesion. No dilatation of the main duct. No intraparenchymal cyst. No peripancreatic edema.  Spleen: No splenomegaly. No focal mass lesion.  Adrenals/Urinary Tract: No adrenal nodule or mass. 2.5 cm water density lesion in the lower pole of the right kidney is likely a cyst. No stones are seen in either  kidney. No ureteral or bladder stones. No secondary changes in either kidney or ureter. Bladder is distended but otherwise unremarkable.  Stomach/Bowel: Stomach is nondistended. No gastric wall thickening. No evidence of outlet obstruction. Duodenum is normally positioned as is the ligament of Treitz. No small bowel wall thickening. No small bowel dilatation. Terminal ileum is normal. The appendix is normal. No gross colonic mass. No colonic wall thickening. No substantial diverticular change.  Vascular/Lymphatic: Atherosclerotic calcification is noted in the wall of the abdominal aorta without aneurysm. No gastrohepatic or hepatoduodenal ligament lymphadenopathy. No retroperitoneal lymphadenopathy. No pelvic sidewall lymphadenopathy.  Reproductive: Probable TURP defect in the central prostate gland. Seminal vesicles are unremarkable.  Other: No intraperitoneal free fluid.  Musculoskeletal: Bone windows reveal no worrisome lytic or sclerotic osseous lesions.  IMPRESSION: No acute findings in the abdomen or pelvis. Specifically, no findings to explain the patient's history of generalized abdominal pain.   Electronically Signed   By: Misty Stanley M.D.   On: 03/04/2014 17:08   Dg Chest 2 View  03/04/2014   CLINICAL DATA:  Left mid to lower axillary rib pain since Friday night. Symptoms are getting worse. No known injury. History of smoking.  EXAM: CHEST  2 VIEW  COMPARISON:  04/06/2013  FINDINGS: Lungs are mildly hyperinflated. Heart size is normal. Lungs are clear. No acute displaced fractures or pneumothorax identified. Prominent nipple shadows bilaterally.  IMPRESSION: No evidence for acute  abnormality.   Electronically Signed   By: Shon Hale M.D.   On: 03/04/2014 16:29    Medications:  Scheduled: . sodium chloride   Intravenous STAT  . cholecalciferol  1,000 Units Oral q morning - 10a  . enoxaparin (LOVENOX) injection  40 mg Subcutaneous Q24H  . feeding supplement (ENSURE COMPLETE)  237 mL Oral Q24H   . metoCLOPramide  5 mg Oral TID AC  . potassium chloride  40 mEq Oral Q4H  . sodium chloride  3 mL Intravenous Q12H  . thyroid  15 mg Oral QAC breakfast  . thyroid  30 mg Oral QAC breakfast  . vitamin C  500 mg Oral q morning - 10a   Continuous:  EKC:[id number] **OR** acetaminophen, alum & mag hydroxide-simeth, bisacodyl, ondansetron **OR** ondansetron (ZOFRAN) IV, polyvinyl alcohol  Assessment/Plan:  Principal Problem:   Diuretic-induced hypokalemia Active Problems:   Essential hypertension, benign   Hypothyroid   Hypokalemia   Generalized abdominal pain   Weight loss   Metabolic alkalosis   Constipation    Diuretic-induced hypokalemia The patient was on very high doses of diuretic therapy (Demadex) for pedal edema. The dosage seemed excessive for simple treatment of edema.Holding Demadex. Potassium remains low. Will supplement aggressively. Mg was ok.   Pedal  Edema Being worked up by the PCP. Get venous dopplers. ECHO was done in September and revealed normal EF and grade 1 diastolic dysfunction.  Essential hypertension, benign Hold Demadex and monitor blood pressure.  Constipation Patient was placed on Reglan and Suppositories. He is very concerned and fixated on having a BM. CT did not reveal obstruction or significant constipation. He apparently underwent colonoscopy recently which was unremarkable. Holding Miralax as it was felt it could have contributed to the hypokalemia though patient denies diarrhea.  Hypothyroid TSH was normal. Continue current meds.   Generalized abdominal pain No evidence of bowel obstruction, enteritis, appendicitis, or other abnormalities that were noted on outside CT scan. Abdominal exam benign.  Weight loss Dietitian consultation requested. No evidence of malignancy on CT scan.  Metabolic alkalosis Suspect contraction alkalosis from diuretics and laxatives. Holding diuretics and laxatives.  DVT Prophylaxis: Enoxaparin    Code  Status: Full Code  Family Communication: Discussed with patient  Disposition Plan: Not ready for discharge.    LOS: 1 day   [facility] Pager [phone] 03/05/2014, 11:16 AM  If 8PM-8AM, please contact night-coverage at [email or web address], password [facility]

## 2014-03-05 NOTE — Progress Notes (Signed)
   03/05/14 1607  PT Time Calculation  PT Start Time (ACUTE ONLY) 1513  PT Stop Time (ACUTE ONLY) 1535  PT Time Calculation (min) (ACUTE ONLY) 22 min  PT G-Codes **NOT FOR INPATIENT CLASS**  Functional Assessment Tool Used (clinical judgement)  Functional Limitation Mobility: Walking and moving around  Mobility: Walking and Moving Around Current Status (N6295) CI  Mobility: Walking and Moving Around Goal Status (M8413) CH  PT General Charges  $$ ACUTE PT VISIT 1 Procedure  PT Evaluation  $Initial PT Evaluation Tier I 1 Procedure  PT Treatments  $Gait Training 8-22 mins  Weston Anna, MPT [phone]

## 2014-03-05 NOTE — Progress Notes (Signed)
UR completed 

## 2014-03-06 ENCOUNTER — Telehealth: Payer: Self-pay | Admitting: *Deleted

## 2014-03-06 DIAGNOSIS — E876 Hypokalemia: Secondary | ICD-10-CM | POA: Diagnosis not present

## 2014-03-06 DIAGNOSIS — T502X5A Adverse effect of carbonic-anhydrase inhibitors, benzothiadiazides and other diuretics, initial encounter: Secondary | ICD-10-CM | POA: Diagnosis not present

## 2014-03-06 DIAGNOSIS — E43 Unspecified severe protein-calorie malnutrition: Secondary | ICD-10-CM | POA: Insufficient documentation

## 2014-03-06 DIAGNOSIS — K5900 Constipation, unspecified: Secondary | ICD-10-CM | POA: Diagnosis not present

## 2014-03-06 LAB — BASIC METABOLIC PANEL
ANION GAP: 8 (ref 5–15)
BUN: 13 mg/dL (ref 6–23)
CHLORIDE: 101 meq/L (ref 96–112)
CO2: 30 mEq/L (ref 19–32)
CREATININE: 1.14 mg/dL (ref 0.50–1.35)
Calcium: 8.8 mg/dL (ref 8.4–10.5)
GFR calc Af Amer: 69 mL/min — ABNORMAL LOW (ref >90)
GFR calc non Af Amer: 59 mL/min — ABNORMAL LOW (ref >90)
Glucose, Bld: 94 mg/dL (ref 70–99)
Potassium: 3.5 mEq/L — ABNORMAL LOW (ref 3.7–5.3)
SODIUM: 139 meq/L (ref 137–147)

## 2014-03-06 LAB — URINE CULTURE
Colony Count: NO GROWTH
Culture: NO GROWTH
Special Requests: NORMAL

## 2014-03-06 LAB — CBC
HCT: 31.2 % — ABNORMAL LOW (ref 39.0–52.0)
Hemoglobin: 10.8 g/dL — ABNORMAL LOW (ref 13.0–17.0)
MCH: 31.6 pg (ref 26.0–34.0)
MCHC: 34.6 g/dL (ref 30.0–36.0)
MCV: 91.2 fL (ref 78.0–100.0)
PLATELETS: 300 10*3/uL (ref 150–400)
RBC: 3.42 MIL/uL — ABNORMAL LOW (ref 4.22–5.81)
RDW: 12.4 % (ref 11.5–15.5)
WBC: 4.5 10*3/uL (ref 4.0–10.5)

## 2014-03-06 MED ORDER — ENSURE COMPLETE PO LIQD: 237.0000 mL | ORAL | Status: AC

## 2014-03-06 MED ORDER — POTASSIUM CHLORIDE CRYS ER 20 MEQ PO TBCR
40.0000 meq | EXTENDED_RELEASE_TABLET | Freq: Two times a day (BID) | ORAL | Status: DC
  Administered 2014-03-06: 40 meq via ORAL
  Filled 2014-03-06: qty 2

## 2014-03-06 MED ORDER — POTASSIUM CHLORIDE CRYS ER 20 MEQ PO TBCR: 40.0000 meq | EXTENDED_RELEASE_TABLET | Freq: Two times a day (BID) | ORAL | Status: DC

## 2014-03-06 MED ORDER — SENNOSIDES-DOCUSATE SODIUM 8.6-50 MG PO TABS: 1.0000 | ORAL_TABLET | Freq: Two times a day (BID) | ORAL | Status: DC | PRN

## 2014-03-06 MED ORDER — BISACODYL 10 MG RE SUPP: 10.0000 mg | Freq: Every day | RECTAL | Status: DC | PRN

## 2014-03-06 NOTE — Telephone Encounter (Signed)
Please work in for this Friday and make sure that they bring an updated medication list with them. Thank you.

## 2014-03-06 NOTE — Telephone Encounter (Signed)
Needs to be seen. If I don't have anything please work in.

## 2014-03-06 NOTE — Progress Notes (Signed)
c-diff sample sent, Lab rejected the sample because the stool was formed.

## 2014-03-06 NOTE — Progress Notes (Signed)
UR completed 

## 2014-03-06 NOTE — Discharge Summary (Signed)
Physician Discharge Summary  Dilan Novosad QPY:[id number] DOB: 05-23-1932 DOA: 03/04/2014  PCP: Iran Planas, PA-C  Admit date: 03/04/2014 Discharge date: 03/06/2014  Time spent: 35 minutes  Recommendations for Outpatient Follow-up:  1. Needs B-met to follow K level.  2. Adjust Bowel regimen as needed.   Discharge Diagnoses:    Diuretic-induced hypokalemia   Essential hypertension, benign   Hypothyroid   Hypokalemia   Generalized abdominal pain   Weight loss   Metabolic alkalosis   Constipation   Protein-calorie malnutrition, severe   Discharge Condition: Stable.   Diet recommendation: Heart healthy  Filed Weights   03/04/14 1940  Weight: 65.7 kg (144 lb 13.5 oz)    History of present illness:  Brent Morrison is an 80 y.o. male who presents with a chief complaints of a 6 month history of constipation, progressive weight loss, and abdominal pain.Patient presented with abdominal pain/constipation which appears to be ongoing for a while. In the ED he was found to be hypokalemic.  Hospital Course:  Diuretic-induced hypokalemia The patient was on very high doses of diuretic therapy (Demadex) for pedal edema. The dosage seemed excessive for simple treatment of edema.Holding Demadex. Potasium increase to 3.5. He will be discharge on Kcl 40 BID. He will need repeat B-met. Continue to diuretic at discharge.   Pedal Edema Being worked up by the PCP.  venous dopplers negative for DVT. ECHO was done in September and revealed normal EF and grade 1 diastolic dysfunction.  Essential hypertension, benign Hold Demadex and monitor blood pressure.  Constipation Patient was placed on Reglan and Suppositories. He is very concerned and fixated on having a BM. CT did not reveal obstruction or significant constipation. He apparently underwent colonoscopy recently which was unremarkable. DC Reglan at discharge to avoid multiple side effect.  suppository prn. Discharge on docusate and senna.    Hypothyroid TSH was normal. Continue current meds.   Generalized abdominal pain No evidence of bowel obstruction, enteritis, appendicitis, or other abnormalities that were noted on outside CT scan. Abdominal exam benign.  Weight loss Dietitian consultation requested. No evidence of malignancy on CT scan. Further evaluation by [facility].   Metabolic alkalosis Suspect contraction alkalosis from diuretics and laxatives. Holding diuretics and laxatives. Resolved.   Procedures:  none  Consultations:  none  Discharge Exam: Filed Vitals:   03/06/14 0545  BP: 118/66  Pulse: 76  Temp: 97.2 F (36.2 C)  Resp: 18    General: No distress.  Cardiovascular: S 1, S 2 RRR Respiratory: CTA  Discharge Instructions You were cared for by a hospitalist during your hospital stay. If you have any questions about your discharge medications or the care you received while you were in the hospital after you are discharged, you can call the unit and asked to speak with the hospitalist on call if the hospitalist that took care of you is not available. Once you are discharged, your primary care physician will handle any further medical issues. Please note that NO REFILLS for any discharge medications will be authorized once you are discharged, as it is imperative that you return to your primary care physician (or establish a relationship with a primary care physician if you do not have one) for your aftercare needs so that they can reassess your need for medications and monitor your lab values.  Discharge Instructions    Diet - low sodium heart healthy    Complete by:  As directed      Increase activity slowly    Complete  by:  As directed           Current Discharge Medication List    START taking these medications   Details  feeding supplement, ENSURE COMPLETE, (ENSURE COMPLETE) LIQD Take 237 mLs by mouth daily. Qty: 30 Bottle, Refills: 0    potassium chloride SA (K-DUR,KLOR-CON) 20 MEQ tablet  Take 2 tablets (40 mEq total) by mouth 2 (two) times daily. Qty: 6 tablet, Refills: 0      CONTINUE these medications which have NOT CHANGED   Details  cholecalciferol (VITAMIN D) 1000 UNITS tablet Take 1,000 Units by mouth every morning.     docusate sodium (COLACE) 100 MG capsule Take 100 mg by mouth daily as needed (For constipation.).     OVER THE COUNTER MEDICATION Take 2,500 mcg by mouth every morning.    polyethylene glycol (MIRALAX / GLYCOLAX) packet Take 17 g by mouth daily as needed (For constipation.).     polyvinyl alcohol (ARTIFICIAL TEARS) 1.4 % ophthalmic solution Place 1 drop into both eyes daily as needed for dry eyes.     !! thyroid (ARMOUR) 15 MG tablet Take 1 tablet (15 mg total) by mouth daily before breakfast. Takes with 30 mg to equal 45 mg Qty: 90 tablet, Refills: 2   Associated Diagnoses: Hypothyroid    !! thyroid (ARMOUR) 30 MG tablet Take 1 tablet (30 mg total) by mouth daily before breakfast. Takes with 15 mg to equal 45 mg Qty: 90 tablet, Refills: 2   Associated Diagnoses: Hypothyroid    vitamin C (ASCORBIC ACID) 500 MG tablet Take 500 mg by mouth every morning.     AMBULATORY NON FORMULARY MEDICATION Compression stocking, knee high 20-30 of pressure, bilateral legs. Qty: 2 Device, Refills: 0     !! - Potential duplicate medications found. Please discuss with provider.    STOP taking these medications     potassium chloride (K-DUR) 10 MEQ tablet      saw palmetto 160 MG capsule      torsemide (DEMADEX) 20 MG tablet        Allergies  Allergen Reactions  . Indomethacin Swelling    Tongue swelling  . Ivp Dye [Iodinated Diagnostic Agents] Swelling    Only of the tongue  . Other Swelling    *veg dye* swelling only of the tongue  . Bumex [Bumetanide]     Rash on arms. Per pharmacy think it is a sensitivity for sulfa.   . Cheese Cheddar Type Flavor Nat [Flavoring Agent] Other (See Comments)    Gout - only eats white cheeses  . Oxycontin  [Oxycodone Hcl] Other (See Comments)    Agitated   . Sulfa Antibiotics     Rash with bumex not any other sulfa drugs.    Follow-up Information    Follow up with BREEBACK, JADE, PA-C In 2 days.   Specialty:  Family Medicine   Why:  you need repeat B-met to follow potassium level.    Contact information:   [address]  [place] [address] [phone]        The results of significant diagnostics from this hospitalization (including imaging, microbiology, ancillary and laboratory) are listed below for reference.    Significant Diagnostic Studies: Ct Abdomen Pelvis Wo Contrast  03/04/2014   CLINICAL DATA:  Initial encounter for all generalized abdominal pain with constipation dysuria for about 2 months. Left-sided rib pain for 3 days.  EXAM: CT ABDOMEN AND PELVIS WITHOUT CONTRAST  TECHNIQUE: Multidetector CT imaging  of the abdomen and pelvis was performed following the standard protocol without IV contrast.  COMPARISON:  None.  FINDINGS: Lower chest:  Unremarkable.  Hepatobiliary: No focal abnormality in the liver on this study without intravenous contrast. No evidence for hepatomegaly. Gallbladder is decompressed. No intrahepatic or extrahepatic biliary dilation.  Pancreas: No focal mass lesion. No dilatation of the main duct. No intraparenchymal cyst. No peripancreatic edema.  Spleen: No splenomegaly. No focal mass lesion.  Adrenals/Urinary Tract: No adrenal nodule or mass. 2.5 cm water density lesion in the lower pole of the right kidney is likely a cyst. No stones are seen in either kidney. No ureteral or bladder stones. No secondary changes in either kidney or ureter. Bladder is distended but otherwise unremarkable.  Stomach/Bowel: Stomach is nondistended. No gastric wall thickening. No evidence of outlet obstruction. Duodenum is normally positioned as is the ligament of Treitz. No small bowel wall thickening. No small bowel dilatation. Terminal ileum is normal. The  appendix is normal. No gross colonic mass. No colonic wall thickening. No substantial diverticular change.  Vascular/Lymphatic: Atherosclerotic calcification is noted in the wall of the abdominal aorta without aneurysm. No gastrohepatic or hepatoduodenal ligament lymphadenopathy. No retroperitoneal lymphadenopathy. No pelvic sidewall lymphadenopathy.  Reproductive: Probable TURP defect in the central prostate gland. Seminal vesicles are unremarkable.  Other: No intraperitoneal free fluid.  Musculoskeletal: Bone windows reveal no worrisome lytic or sclerotic osseous lesions.  IMPRESSION: No acute findings in the abdomen or pelvis. Specifically, no findings to explain the patient's history of generalized abdominal pain.   Electronically Signed   By: Misty Stanley M.D.   On: 03/04/2014 17:08   Dg Chest 2 View  03/04/2014   CLINICAL DATA:  Left mid to lower axillary rib pain since Friday night. Symptoms are getting worse. No known injury. History of smoking.  EXAM: CHEST  2 VIEW  COMPARISON:  04/06/2013  FINDINGS: Lungs are mildly hyperinflated. Heart size is normal. Lungs are clear. No acute displaced fractures or pneumothorax identified. Prominent nipple shadows bilaterally.  IMPRESSION: No evidence for acute  abnormality.   Electronically Signed   By: Shon Hale M.D.   On: 03/04/2014 16:29    Microbiology: Recent Results (from the past 240 hour(s))  Urine culture     Status: None   Collection Time: 03/04/14  3:33 PM  Result Value Ref Range Status   Specimen Description URINE, CLEAN CATCH  Final   Special Requests Normal  Final   Culture  Setup Time   Final    03/04/2014 23:08 Performed at [place] Performed at [facility]   Final   Culture NO GROWTH Performed at [facility]   Final   Report Status 03/06/2014 FINAL  Final     Labs: Basic Metabolic Panel:  Recent Labs Lab 03/04/14 1547 03/04/14 1809 03/05/14 0443 03/05/14 1437  03/05/14 1816 03/06/14 0504  NA 133*  --  137  --  137 139  K 2.3*  --  2.8*  --  4.2 3.5*  CL 81*  --  92*  --  97 101  CO2 39*  --  36*  --  31 30  GLUCOSE 97  --  94  --  137* 94  BUN 19  --  13  --  15 13  CREATININE 1.26  --  1.15  --  1.09 1.14  CALCIUM 9.7  --  8.4  --  8.2* 8.8  MG  --  1.8  --  1.8  --   --    Liver Function Tests:  Recent Labs Lab 03/04/14 1547  AST 16  ALT 9  ALKPHOS 81  BILITOT 0.4  PROT 6.9  ALBUMIN 3.4*    Recent Labs Lab 03/04/14 1547  LIPASE 22   No results for input(s): AMMONIA in the last 168 hours. CBC:  Recent Labs Lab 03/04/14 1547 03/06/14 0504  WBC 6.5 4.5  NEUTROABS 4.7  --   HGB 13.1 10.8*  HCT 37.5* 31.2*  MCV 90.1 91.2  PLT 380 300   Cardiac Enzymes: No results for input(s): CKTOTAL, CKMB, CKMBINDEX, TROPONINI in the last 168 hours. BNP: BNP (last 3 results) No results for input(s): PROBNP in the last 8760 hours. CBG: No results for input(s): GLUCAP in the last 168 hours.     SignedNiel Hummer A  [facility] 03/06/2014, 12:17 PM

## 2014-03-06 NOTE — Telephone Encounter (Signed)
Patient was just discharged from hospital for very low potassium and he is to f/u in 2 days. He needs to be seen by Friday. Can he just have a lab redraw or does he need to be seen? Please advise. Margette Fast, RMA

## 2014-03-08 ENCOUNTER — Ambulatory Visit (INDEPENDENT_AMBULATORY_CARE_PROVIDER_SITE_OTHER): Payer: Medicare Other | Admitting: Physician Assistant

## 2014-03-08 ENCOUNTER — Encounter: Payer: Self-pay | Admitting: Physician Assistant

## 2014-03-08 VITALS — BP 119/73 | HR 75 | Ht 70.0 in | Wt 158.0 lb

## 2014-03-08 DIAGNOSIS — I878 Other specified disorders of veins: Secondary | ICD-10-CM

## 2014-03-08 DIAGNOSIS — E876 Hypokalemia: Secondary | ICD-10-CM

## 2014-03-08 MED ORDER — LINACLOTIDE 145 MCG PO CAPS: 145.0000 ug | ORAL_CAPSULE | Freq: Every day | ORAL | Status: DC

## 2014-03-08 MED ORDER — SENNOSIDES-DOCUSATE SODIUM 8.6-50 MG PO TABS: 1.0000 | ORAL_TABLET | Freq: Two times a day (BID) | ORAL | Status: DC | PRN

## 2014-03-08 MED ORDER — POTASSIUM CHLORIDE ER 10 MEQ PO TBCR: EXTENDED_RELEASE_TABLET | ORAL | Status: DC

## 2014-03-08 NOTE — Progress Notes (Signed)
   Subjective:    Patient ID: Brent Morrison, male    DOB: 10-13-1932, 80 y.o.   MRN: [id number]  HPI  Pt is a 80 yo male who presents to the clinic to follow up from hospital. DX of hypokalemia from overusing diuretic for bilateral leg edema from chronic venous stasis. Entered with potassium of 2.5 and discharged on 03/08/14 with potassium of 3.5. CT abdomen negative. CT chest negative. Lower extremity dopplers negative. He complains of constipation since leaving hospital. Never got duclolax filled. Does not like miralax. Describes a pill in the hospital that worked well that he would like to have a refill of. Currently off all diuretics. Taking potassium 40mg  twice a day but yesterday only took 30mg  twice a day. Continues to have bilateral leg edema. Not using compression stockings. Feeling much better.   Review of Systems  All other systems reviewed and are negative.      Objective:   Physical Exam  Constitutional: He is oriented to person, place, and time. He appears well-developed and well-nourished.  HENT:  Head: Normocephalic and atraumatic.  Eyes: Conjunctivae are normal. Right eye exhibits no discharge. Left eye exhibits no discharge.  Neck: Normal range of motion. Neck supple.  Cardiovascular: Normal rate, regular rhythm and normal heart sounds.   Pulmonary/Chest: Effort normal and breath sounds normal.  Lymphadenopathy:    He has no cervical adenopathy.  Neurological: He is alert and oriented to person, place, and time.  Skin:  Bilateral 2+ pitting edema up into calf.  Pedal pulse presents symetric.   Psychiatric: He has a normal mood and affect. His behavior is normal.          Assessment & Plan:  Hypokalemia/overuse of diruretics- will recheck BMP today and make adjustments accordingly and follow closely for next weeks. Stay off diruretics.   Chronic venous stasis- spent time discussing dx. Talked about importance of compression. Pt aware. Discussed elevation daily of  feet. Discussed salt restriction. Right now we are not going to add a dirurteic but we can certainly add low dose daily if we get everything regulated.gave HO for patient.discussed we have done work up for other causes of leg edema.  Follow up in 1 week.

## 2014-03-08 NOTE — Patient Instructions (Signed)

## 2014-03-09 LAB — BASIC METABOLIC PANEL
BUN: 16 mg/dL (ref 6–23)
CALCIUM: 8.6 mg/dL (ref 8.4–10.5)
CHLORIDE: 103 meq/L (ref 96–112)
CO2: 26 mEq/L (ref 19–32)
CREATININE: 1.06 mg/dL (ref 0.50–1.35)
Glucose, Bld: 86 mg/dL (ref 70–99)
Potassium: 5.3 mEq/L (ref 3.5–5.3)
Sodium: 138 mEq/L (ref 135–145)

## 2014-03-11 ENCOUNTER — Telehealth: Payer: Self-pay | Admitting: *Deleted

## 2014-03-11 DIAGNOSIS — E875 Hyperkalemia: Secondary | ICD-10-CM

## 2014-03-11 NOTE — Telephone Encounter (Signed)
Labs ordered.

## 2014-03-13 DIAGNOSIS — E875 Hyperkalemia: Secondary | ICD-10-CM | POA: Diagnosis not present

## 2014-03-14 LAB — BASIC METABOLIC PANEL WITH GFR
BUN: 15 mg/dL (ref 6–23)
CO2: 26 mEq/L (ref 19–32)
Calcium: 8.8 mg/dL (ref 8.4–10.5)
Chloride: 104 mEq/L (ref 96–112)
Creat: 1.05 mg/dL (ref 0.50–1.35)
GFR, Est African American: 78 mL/min
GFR, Est Non African American: 67 mL/min
Glucose, Bld: 82 mg/dL (ref 70–99)
POTASSIUM: 4.8 meq/L (ref 3.5–5.3)
SODIUM: 136 meq/L (ref 135–145)

## 2014-03-15 ENCOUNTER — Ambulatory Visit (INDEPENDENT_AMBULATORY_CARE_PROVIDER_SITE_OTHER): Payer: Medicare Other | Admitting: Physician Assistant

## 2014-03-15 ENCOUNTER — Encounter: Payer: Self-pay | Admitting: Physician Assistant

## 2014-03-15 VITALS — BP 129/76 | HR 77 | Ht 70.0 in | Wt 157.0 lb

## 2014-03-15 DIAGNOSIS — K5900 Constipation, unspecified: Secondary | ICD-10-CM | POA: Diagnosis not present

## 2014-03-15 DIAGNOSIS — E876 Hypokalemia: Secondary | ICD-10-CM

## 2014-03-15 DIAGNOSIS — I872 Venous insufficiency (chronic) (peripheral): Secondary | ICD-10-CM | POA: Diagnosis not present

## 2014-03-15 DIAGNOSIS — T502X5A Adverse effect of carbonic-anhydrase inhibitors, benzothiadiazides and other diuretics, initial encounter: Principal | ICD-10-CM

## 2014-03-15 DIAGNOSIS — R600 Localized edema: Secondary | ICD-10-CM | POA: Diagnosis not present

## 2014-03-15 MED ORDER — POTASSIUM CHLORIDE ER 10 MEQ PO TBCR: EXTENDED_RELEASE_TABLET | ORAL | Status: DC

## 2014-03-15 NOTE — Patient Instructions (Signed)
Take 2 tablets twice a day of potassium.  2 weeks of compression stockings.

## 2014-03-18 DIAGNOSIS — I872 Venous insufficiency (chronic) (peripheral): Secondary | ICD-10-CM | POA: Insufficient documentation

## 2014-03-18 NOTE — Progress Notes (Signed)
   Subjective:    Patient ID: Brent Morrison, male    DOB: 03/13/1933, 80 y.o.   MRN: [id number]  HPI Pt presents to the clinic for 1 week follow up on hypokalemia and chronic venous insuffiency. Wearing ace wraps which helps swelling in legs. Not able to put around ankle and foot because cannot wear shoe. Constipation controlled right now but taking medications daily for this. Does not like miralax because stopped working. Not clear what constipation medications he is taking.    Review of Systems  All other systems reviewed and are negative.      Objective:   Physical Exam  Constitutional: He appears well-developed and well-nourished.  HENT:  Head: Normocephalic and atraumatic.  Cardiovascular: Normal rate, regular rhythm and normal heart sounds.   Pulmonary/Chest: Effort normal and breath sounds normal. He has no wheezes.  Skin: Skin is dry.  Edema better in legs but ace not wrapped around foot and lower ankle.  Swelling 2+ pitting edema bilaterally.  Pedal pulses 2+.   Psychiatric: He has a normal mood and affect. His behavior is normal.          Assessment & Plan:  Hypokalemia- .Marland Kitchen Results for orders placed or performed in visit on 94/50/38  BASIC METABOLIC PANEL WITH GFR  Result Value Ref Range   Sodium 136 135 - 145 mEq/L   Potassium 4.8 3.5 - 5.3 mEq/L   Chloride 104 96 - 112 mEq/L   CO2 26 19 - 32 mEq/L   Glucose, Bld 82 70 - 99 mg/dL   BUN 15 6 - 23 mg/dL   Creat 1.05 0.50 - 1.35 mg/dL   Calcium 8.8 8.4 - 10.5 mg/dL   GFR, Est African American 78 mL/min   GFR, Est Non African American 67 mL/min   Decreased potassium to 2 tables of 64meq twice a day. Recheck next Wednesday. Follow up in 2 weeks.   Chronic venous insuffiency- using ace wrap and swelling has improved. Pt is getting some elastic hose to go over feet to help with swelling there as well. Right now plan is to stay off diuretic. Will reevaluate in 2 weeks.   Constipation- still not completely clear on  what medication pt is taking. Discussed not to stay on senna regularly or any laxative. Consider metamucil and stool softener. Next visit bring in meds he is taking to stay un-constipation to make sure safe.

## 2014-03-26 ENCOUNTER — Other Ambulatory Visit: Payer: Self-pay | Admitting: Physician Assistant

## 2014-06-13 ENCOUNTER — Other Ambulatory Visit: Payer: Self-pay | Admitting: Physician Assistant

## 2014-06-13 NOTE — Telephone Encounter (Signed)
Due for f/u

## 2014-07-05 ENCOUNTER — Encounter: Payer: Self-pay | Admitting: Physician Assistant

## 2014-07-05 ENCOUNTER — Ambulatory Visit (INDEPENDENT_AMBULATORY_CARE_PROVIDER_SITE_OTHER): Payer: Medicare Other

## 2014-07-05 ENCOUNTER — Ambulatory Visit (INDEPENDENT_AMBULATORY_CARE_PROVIDER_SITE_OTHER): Payer: Medicare Other | Admitting: Physician Assistant

## 2014-07-05 VITALS — BP 134/74 | HR 76 | Ht 70.0 in | Wt 161.0 lb

## 2014-07-05 DIAGNOSIS — M546 Pain in thoracic spine: Secondary | ICD-10-CM

## 2014-07-05 DIAGNOSIS — E538 Deficiency of other specified B group vitamins: Secondary | ICD-10-CM

## 2014-07-05 DIAGNOSIS — E039 Hypothyroidism, unspecified: Secondary | ICD-10-CM

## 2014-07-05 DIAGNOSIS — M549 Dorsalgia, unspecified: Secondary | ICD-10-CM

## 2014-07-05 DIAGNOSIS — N183 Chronic kidney disease, stage 3 unspecified: Secondary | ICD-10-CM

## 2014-07-05 DIAGNOSIS — I872 Venous insufficiency (chronic) (peripheral): Secondary | ICD-10-CM

## 2014-07-05 DIAGNOSIS — E876 Hypokalemia: Secondary | ICD-10-CM

## 2014-07-05 DIAGNOSIS — M8588 Other specified disorders of bone density and structure, other site: Secondary | ICD-10-CM | POA: Diagnosis not present

## 2014-07-05 DIAGNOSIS — M5032 Other cervical disc degeneration, mid-cervical region: Secondary | ICD-10-CM | POA: Diagnosis not present

## 2014-07-05 DIAGNOSIS — K5900 Constipation, unspecified: Secondary | ICD-10-CM

## 2014-07-05 DIAGNOSIS — M5134 Other intervertebral disc degeneration, thoracic region: Secondary | ICD-10-CM | POA: Diagnosis not present

## 2014-07-05 LAB — POCT URINALYSIS DIPSTICK
Bilirubin, UA: NEGATIVE
Glucose, UA: NEGATIVE
KETONES UA: NEGATIVE
Leukocytes, UA: NEGATIVE
Nitrite, UA: NEGATIVE
PH UA: 7
Protein, UA: NEGATIVE
Spec Grav, UA: 1.015
Urobilinogen, UA: 0.2

## 2014-07-05 MED ORDER — BISACODYL 10 MG RE SUPP: 10.0000 mg | Freq: Every day | RECTAL | Status: DC | PRN

## 2014-07-05 NOTE — Progress Notes (Signed)
   Subjective:    Patient ID: Brent Morrison, male    DOB: [birth year], 81 y.o.   MRN: [id number]  HPI  Patient is a 81 year old male who presents to the clinic to follow-up and to discuss 2 weeks of will get cervical and thoracic back x-rays today to look for Patient does have a history of chronic kidney disease-hypokalemia and urinary tract infections. Bilateral mid back pain started about 2 weeks ago. He does admit that his worse after being active, lifting or pulling things. Denies any abdominal pain. Denies any painful urination or increase in urinary frequency. Denies any odor in urine. Patient has no fever, chills nausea or vomiting.  Review of Systems  All other systems reviewed and are negative.      Objective:   Physical Exam  Constitutional: He is oriented to person, place, and time. He appears well-developed and well-nourished.  HENT:  Head: Normocephalic and atraumatic.  Cardiovascular: Normal rate, regular rhythm and normal heart sounds.   Pulmonary/Chest:  Slight kyphosis of thoracic spine.  No CVA tenderness bilaterally.  Abdominal: Soft. Bowel sounds are normal. He exhibits no distension and no mass. There is no tenderness. There is no rebound and no guarding.  Musculoskeletal:  No tenderness over C-spine or thoracic spine to palpation.  Neurological: He is alert and oriented to person, place, and time.  Skin: Skin is dry.  Psychiatric: He has a normal mood and affect. His behavior is normal.          Assessment & Plan:  Bilateral mid back pain-UA dipstick trace blood, no leuks or nitrates. Will culture. I do not suspect urinary tract infection. Patient does not have any CVA tenderness. I do feel like could be more musculoskelatal. Will get cervical and thoracic back xrays. From hx sounds like pain is worse with exertion and lifting or picking up.  If pain persist will get renal ultrasound. Patient cannot take NSAIDs. Did discuss he can take Tylenol as needed and  consider warm compresses over his mid back. If pain worsens or change please follow-up.   Hypokalemia/chronic kidney disease- will get CMP today. Updated med list. Patient is off all diuretics.  Chronic venous stasis-swelling is much better with compression stockings. He is off all diuretics.  History of B12 deficiency-we'll recheck today.  Hypothyroidism-we'll get levels and refill medicines accordingly.  Constipation-patient does not feel like MiraLAX daily helps. He cannot afford linzess/amitiza. He uses senna-doscuate and dulcolax as needed but not regularly. Refilled today.

## 2014-07-05 NOTE — Patient Instructions (Addendum)
Will get xrays of back.  Go and get bloodwork.  Will get renal ultrasound.  Try tylenol 1000mg  up to three times a day.   Murad age spot removal.

## 2014-07-06 LAB — COMPLETE METABOLIC PANEL WITH GFR
ALT: 11 U/L (ref 0–53)
AST: 13 U/L (ref 0–37)
Albumin: 3.7 g/dL (ref 3.5–5.2)
Alkaline Phosphatase: 69 U/L (ref 39–117)
BUN: 16 mg/dL (ref 6–23)
CHLORIDE: 102 meq/L (ref 96–112)
CO2: 28 mEq/L (ref 19–32)
Calcium: 8.4 mg/dL (ref 8.4–10.5)
Creat: 1.09 mg/dL (ref 0.50–1.35)
GFR, Est African American: 74 mL/min
GFR, Est Non African American: 64 mL/min
Glucose, Bld: 88 mg/dL (ref 70–99)
Potassium: 4.2 mEq/L (ref 3.5–5.3)
Sodium: 138 mEq/L (ref 135–145)
TOTAL PROTEIN: 5.7 g/dL — AB (ref 6.0–8.3)
Total Bilirubin: 0.3 mg/dL (ref 0.2–1.2)

## 2014-07-06 LAB — MAGNESIUM: Magnesium: 2.1 mg/dL (ref 1.5–2.5)

## 2014-07-06 LAB — VITAMIN B12: Vitamin B-12: 405 pg/mL (ref 211–911)

## 2014-07-06 LAB — TSH: TSH: 0.744 u[IU]/mL (ref 0.350–4.500)

## 2014-07-08 ENCOUNTER — Encounter: Payer: Self-pay | Admitting: Physician Assistant

## 2014-07-08 DIAGNOSIS — L089 Local infection of the skin and subcutaneous tissue, unspecified: Secondary | ICD-10-CM | POA: Diagnosis not present

## 2014-07-08 DIAGNOSIS — M5030 Other cervical disc degeneration, unspecified cervical region: Secondary | ICD-10-CM | POA: Insufficient documentation

## 2014-07-08 DIAGNOSIS — M8580 Other specified disorders of bone density and structure, unspecified site: Secondary | ICD-10-CM | POA: Insufficient documentation

## 2014-07-18 ENCOUNTER — Other Ambulatory Visit: Payer: Self-pay | Admitting: Family Medicine

## 2014-08-01 ENCOUNTER — Other Ambulatory Visit: Payer: Self-pay | Admitting: *Deleted

## 2014-08-01 MED ORDER — THYROID 30 MG PO TABS: ORAL_TABLET | ORAL | Status: DC

## 2014-08-01 MED ORDER — THYROID 15 MG PO TABS: ORAL_TABLET | ORAL | Status: DC

## 2014-08-29 DIAGNOSIS — Q6100 Congenital renal cyst, unspecified: Secondary | ICD-10-CM | POA: Diagnosis not present

## 2014-08-29 DIAGNOSIS — R3911 Hesitancy of micturition: Secondary | ICD-10-CM | POA: Diagnosis not present

## 2014-08-29 DIAGNOSIS — N401 Enlarged prostate with lower urinary tract symptoms: Secondary | ICD-10-CM | POA: Diagnosis not present

## 2014-08-29 DIAGNOSIS — N320 Bladder-neck obstruction: Secondary | ICD-10-CM | POA: Diagnosis not present

## 2014-12-06 ENCOUNTER — Encounter: Payer: Self-pay | Admitting: Family Medicine

## 2014-12-06 ENCOUNTER — Ambulatory Visit (INDEPENDENT_AMBULATORY_CARE_PROVIDER_SITE_OTHER): Payer: Medicare Other | Admitting: Family Medicine

## 2014-12-06 VITALS — BP 138/80 | HR 77 | Ht 70.0 in | Wt 165.0 lb

## 2014-12-06 DIAGNOSIS — E876 Hypokalemia: Secondary | ICD-10-CM | POA: Diagnosis not present

## 2014-12-06 NOTE — Progress Notes (Signed)
Brent Morrison is a 81 y.o. male who presents to [facility]  today for fatigue and some memory loss. Over the past month or so patient has been slightly more forgetful and slightly fatigued. He states he felt this way when his potassium was low at 2 years ago. He denies any palpitations shortness of breath chest pains. No new medications. He feels well otherwise. No cramping.   Past Medical History  Diagnosis Date  . Hypothyroidism   . Gout   . BPH (benign prostatic hyperplasia) 03-20-12    difficult to void after surgery( if catherterized, desires to stay overnight).  . Allergy     occ. dry cough  . 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 Complication of anesthesia     "difficulty with urination after anesthesia"  . Bronchitis     "had it when I was a little boy; I out grew that" (03/29/2012)  . Hypertension     pcp  [place]   dr Lilla Shook   Past Surgical History  Procedure Laterality Date  . Eye surgery    . Inguinal hernia repair  03/29/2012    left w/mesh (03/29/2012)  . Cataract extraction w/ intraocular lens  implant, bilateral  2001  . Elbow fracture surgery  ~ 1954    "left" (03/29/2012)  . Retinal detachment surgery  1994, 2001    "both eyes" (03/29/2012)  . Inguinal hernia repair  03/29/2012    Procedure: HERNIA REPAIR INGUINAL ADULT;  Surgeon: Harl Bowie, MD;  Location: [place];  Service: General;  Laterality: Left;  . Insertion of mesh  03/29/2012    Procedure: INSERTION OF MESH;  Surgeon: Harl Bowie, MD;  Location: [place];  Service: General;  Laterality: Left;  . Transurethral resection of prostate  02/13/13  . Inguinal hernia repair Right 06/05/2013    Procedure: HERNIA REPAIR INGUINAL ADULT;  Surgeon: Harl Bowie, MD;  Location: [place];  Service: General;  Laterality: Right;  . Insertion of mesh Right 06/05/2013    Procedure: INSERTION OF MESH;  Surgeon:  Harl Bowie, MD;  Location: [place];  Service: General;  Laterality: Right;   Social History  Substance Use Topics  . Smoking status: Former Smoker -- 1.00 packs/day for 10 years    Types: Cigarettes    Quit date: 04/26/1961  . Smokeless tobacco: Never Used  . Alcohol Use: No   ROS as above Medications: Current Outpatient Prescriptions  Medication Sig Dispense Refill  . AMBULATORY NON FORMULARY MEDICATION Compression stocking, knee high 20-30 of pressure, bilateral legs. 2 Device 0  . bisacodyl (DULCOLAX) 10 MG suppository Place 1 suppository (10 mg total) rectally daily as needed for moderate constipation. 12 suppository 0  . cholecalciferol (VITAMIN D) 1000 UNITS tablet Take 1,000 Units by mouth every morning.     . feeding supplement, ENSURE COMPLETE, (ENSURE COMPLETE) LIQD Take 237 mLs by mouth daily. 30 Bottle 0  . OVER THE COUNTER MEDICATION Take 2,500 mcg by mouth every morning.    . polyvinyl alcohol (ARTIFICIAL TEARS) 1.4 % ophthalmic solution Place 1 drop into both eyes daily as needed for dry eyes.     Marland Kitchen senna-docusate (SENOKOT-S) 8.6-50 MG per tablet Take 1 tablet by mouth 2 (two) times daily as needed for mild constipation. 30 tablet 0  . thyroid (ARMOUR THYROID) 15 MG tablet TAKE 1 TABLET BY MOUTH EVERY MORNING WITH THE 30 MG TABLET  TO EQUAL 45 MG 90 tablet 1  . thyroid (ARMOUR THYROID) 30 MG tablet TAKE 1 TABLET BY MOUTH EVERY MORNING WITH 15 MG TABLET TO EQUAL 45 MG 90 tablet 1  . vitamin C (ASCORBIC ACID) 500 MG tablet Take 500 mg by mouth every morning.      No current facility-administered medications for this visit.   Allergies  Allergen Reactions  . Indomethacin Swelling    Tongue swelling  . Ivp Dye [Iodinated Diagnostic Agents] Swelling    Only of the tongue  . Other Swelling    *veg dye* swelling only of the tongue  . Bumex [Bumetanide]     Rash on arms. Per pharmacy think it is a sensitivity for sulfa.   . Cheese Cheddar Type Flavor Nat [Flavoring  Agent] Other (See Comments)    Gout - only eats white cheeses  . Oxycontin [Oxycodone Hcl] Other (See Comments)    Agitated   . Sulfa Antibiotics     Rash with bumex not any other sulfa drugs.      Exam:  BP 138/80 mmHg  Pulse 77  Ht 5\' 10"  (1.778 m)  Wt 165 lb (74.844 kg)  BMI 23.68 kg/m2 Gen: Well NAD HEENT: EOMI,  MMM Lungs: Normal work of breathing. CTABL Heart: RRR no MRG Abd: NABS, Soft. Nondistended, Nontender Exts: Brisk capillary refill, warm and well perfused.  Neuro: Alert and oriented normal coordination and gait   No results found for this or any previous visit (from the past 24 hour(s)). No results found.   Please see individual assessment and plan sections.

## 2014-12-06 NOTE — Assessment & Plan Note (Signed)
Concern for hypokalemia. Check metabolic panel and magnesium. Follow-up with PCP.

## 2014-12-06 NOTE — Patient Instructions (Signed)
Thank you for coming in today. Get labs today.  We will call with results.  Call or go to the emergency room if you get worse, have trouble breathing, have chest pains, or palpitations.   Hypokalemia Hypokalemia means that the amount of potassium in the blood is lower than normal.Potassium is a chemical, called an electrolyte, that helps regulate the amount of fluid in the body. It also stimulates muscle contraction and helps nerves function properly.Most of the body's potassium is inside of cells, and only a very small amount is in the blood. Because the amount in the blood is so small, minor changes can be life-threatening. CAUSES  Antibiotics.  Diarrhea or vomiting.  Using laxatives too much, which can cause diarrhea.  Chronic kidney disease.  Water pills (diuretics).  Eating disorders (bulimia).  Low magnesium level.  Sweating a lot. SIGNS AND SYMPTOMS  Weakness.  Constipation.  Fatigue.  Muscle cramps.  Mental confusion.  Skipped heartbeats or irregular heartbeat (palpitations).  Tingling or numbness. DIAGNOSIS  Your health care provider can diagnose hypokalemia with blood tests. In addition to checking your potassium level, your health care provider may also check other lab tests. TREATMENT Hypokalemia can be treated with potassium supplements taken by mouth or adjustments in your current medicines. If your potassium level is very low, you may need to get potassium through a vein (IV) and be monitored in the hospital. A diet high in potassium is also helpful. Foods high in potassium are:  Nuts, such as peanuts and pistachios.  Seeds, such as sunflower seeds and pumpkin seeds.  Peas, lentils, and lima beans.  Whole grain and bran cereals and breads.  Fresh fruit and vegetables, such as apricots, avocado, bananas, cantaloupe, kiwi, oranges, tomatoes, asparagus, and potatoes.  Orange and tomato juices.  Red meats.  Fruit yogurt. HOME CARE  INSTRUCTIONS  Take all medicines as prescribed by your health care provider.  Maintain a healthy diet by including nutritious food, such as fruits, vegetables, nuts, whole grains, and lean meats.  If you are taking a laxative, be sure to follow the directions on the label. SEEK MEDICAL CARE IF:  Your weakness gets worse.  You feel your heart pounding or racing.  You are vomiting or having diarrhea.  You are diabetic and having trouble keeping your blood glucose in the normal range. SEEK IMMEDIATE MEDICAL CARE IF:  You have chest pain, shortness of breath, or dizziness.  You are vomiting or having diarrhea for more than 2 days.  You faint. MAKE SURE YOU:   Understand these instructions.  Will watch your condition.  Will get help right away if you are not doing well or get worse. Document Released: 04/12/2005 Document Revised: 01/31/2013 Document Reviewed: 10/13/2012 [facility] Patient Information 2015 [place], [place]. This information is not intended to replace advice given to you by your health care provider. Make sure you discuss any questions you have with your health care provider.

## 2014-12-07 LAB — COMPLETE METABOLIC PANEL WITH GFR
ALT: 11 U/L (ref 9–46)
AST: 13 U/L (ref 10–35)
Albumin: 3.4 g/dL — ABNORMAL LOW (ref 3.6–5.1)
Alkaline Phosphatase: 51 U/L (ref 40–115)
BILIRUBIN TOTAL: 0.5 mg/dL (ref 0.2–1.2)
BUN: 17 mg/dL (ref 7–25)
CO2: 27 mmol/L (ref 20–31)
Calcium: 8.7 mg/dL (ref 8.6–10.3)
Chloride: 106 mmol/L (ref 98–110)
Creat: 1.08 mg/dL (ref 0.70–1.11)
GFR, EST AFRICAN AMERICAN: 75 mL/min (ref >=60)
GFR, Est Non African American: 64 mL/min (ref >=60)
GLUCOSE: 89 mg/dL (ref 65–99)
POTASSIUM: 4.5 mmol/L (ref 3.5–5.3)
SODIUM: 141 mmol/L (ref 135–146)
Total Protein: 5.2 g/dL — ABNORMAL LOW (ref 6.1–8.1)

## 2014-12-07 LAB — MAGNESIUM: MAGNESIUM: 1.9 mg/dL (ref 1.5–2.5)

## 2014-12-07 NOTE — Progress Notes (Signed)
Quick Note:  Potassium is normal. F/u with PCP ______

## 2014-12-25 ENCOUNTER — Encounter: Payer: Self-pay | Admitting: Physician Assistant

## 2014-12-25 ENCOUNTER — Ambulatory Visit (INDEPENDENT_AMBULATORY_CARE_PROVIDER_SITE_OTHER): Payer: Medicare Other | Admitting: Physician Assistant

## 2014-12-25 VITALS — BP 141/86 | HR 86 | Wt 167.0 lb

## 2014-12-25 DIAGNOSIS — T502X5A Adverse effect of carbonic-anhydrase inhibitors, benzothiadiazides and other diuretics, initial encounter: Principal | ICD-10-CM

## 2014-12-25 DIAGNOSIS — Z823 Family history of stroke: Secondary | ICD-10-CM

## 2014-12-25 DIAGNOSIS — E876 Hypokalemia: Secondary | ICD-10-CM

## 2014-12-25 DIAGNOSIS — N182 Chronic kidney disease, stage 2 (mild): Secondary | ICD-10-CM | POA: Diagnosis not present

## 2014-12-25 DIAGNOSIS — F05 Delirium due to known physiological condition: Secondary | ICD-10-CM

## 2014-12-25 DIAGNOSIS — N183 Chronic kidney disease, stage 3 unspecified: Secondary | ICD-10-CM

## 2014-12-25 DIAGNOSIS — R400 Somnolence: Secondary | ICD-10-CM

## 2014-12-25 DIAGNOSIS — Z13220 Encounter for screening for lipoid disorders: Secondary | ICD-10-CM

## 2014-12-25 DIAGNOSIS — E8809 Other disorders of plasma-protein metabolism, not elsewhere classified: Secondary | ICD-10-CM

## 2014-12-25 DIAGNOSIS — R2231 Localized swelling, mass and lump, right upper limb: Secondary | ICD-10-CM

## 2014-12-25 NOTE — Patient Instructions (Addendum)
Chronic Kidney Disease Chronic kidney disease occurs when the kidneys are damaged over a long period. The kidneys are two organs that lie on either side of the spine between the middle of the back and the front of the abdomen. The kidneys:   Remove wastes and extra water from the blood.   Produce important hormones. These help keep bones strong, regulate blood pressure, and help create red blood cells.   Balance the fluids and chemicals in the blood and tissues. A small amount of kidney damage may not cause problems, but a large amount of damage may make it difficult or impossible for the kidneys to work the way they should. If steps are not taken to slow down the kidney damage or stop it from getting worse, the kidneys may stop working permanently. Most of the time, chronic kidney disease does not go away. However, it can often be controlled, and those with the disease can usually live normal lives. CAUSES  The most common causes of chronic kidney disease are diabetes and high blood pressure (hypertension). Chronic kidney disease may also be caused by:   Diseases that cause the kidneys' filters to become inflamed.   Diseases that affect the immune system.   Genetic diseases.   Medicines that damage the kidneys, such as anti-inflammatory medicines.  Poisoning or exposure to toxic substances.   A reoccurring kidney or urinary infection.   A problem with urine flow. This may be caused by:   Cancer.   Kidney stones.   An enlarged prostate in males. SIGNS AND SYMPTOMS  Because the kidney damage in chronic kidney disease occurs slowly, symptoms develop slowly and may not be obvious until the kidney damage becomes severe. A person may have a kidney disease for years without showing any symptoms. Symptoms can include:   Swelling (edema) of the legs, ankles, or feet.   Tiredness (lethargy).   Nausea or vomiting.   Confusion.   Problems with urination, such as:    Decreased urine production.   Frequent urination, especially at night.   Frequent accidents in children who are potty trained.   Muscle twitches and cramps.   Shortness of breath.  Weakness.   Persistent itchiness.   Loss of appetite.  Metallic taste in the mouth.  Trouble sleeping.  Slowed development in children.  Short stature in children. DIAGNOSIS  Chronic kidney disease may be detected and diagnosed by tests, including blood, urine, imaging, or kidney biopsy tests.  TREATMENT  Most chronic kidney diseases cannot be cured. Treatment usually involves relieving symptoms and preventing or slowing the progression of the disease. Treatment may include:   A special diet. You may need to avoid alcohol and foods thatare salty and high in potassium.   Medicines. These may:   Lower blood pressure.   Relieve anemia.   Relieve swelling.   Protect the bones. HOME CARE INSTRUCTIONS   Follow your prescribed diet.   Take medicines only as directed by your health care provider. Do not take any new medicines (prescription, over-the-counter, or nutritional supplements) unless approved by your health care provider. Many medicines can worsen your kidney damage or need to have the dose adjusted.   Quit smoking if you smoke. Talk to your health care provider about a smoking cessation program.   Keep all follow-up visits as directed by your health care provider. SEEK IMMEDIATE MEDICAL CARE IF:  Your symptoms get worse or you develop new symptoms.   You develop symptoms of end-stage kidney disease. These  include:   Headaches.   Abnormally dark or light skin.   Numbness in the hands or feet.   Easy bruising.   Frequent hiccups.   Menstruation stops.   You have a fever.   You have decreased urine production.   You havepain or bleeding when urinating. MAKE SURE YOU:  Understand these instructions.  Will watch your condition.  Will  get help right away if you are not doing well or get worse. FOR MORE INFORMATION   American Association of Kidney Patients: BombTimer.gl  National Kidney Foundation: www.kidney.[place]: https://mathis.com/  [facility]: www.lifeoptions.org and www.kidneyschool.org Document Released: 01/20/2008 Document Revised: 08/27/2013 Document Reviewed: 12/10/2011 [facility] Patient Information 2015 [place], [place]. This information is not intended to replace advice given to you by your health care provider. Make sure you discuss any questions you have with your health care provider.   Carotid ultrasound

## 2014-12-26 ENCOUNTER — Other Ambulatory Visit: Payer: Self-pay | Admitting: Physician Assistant

## 2014-12-26 DIAGNOSIS — F05 Delirium due to known physiological condition: Secondary | ICD-10-CM

## 2014-12-26 DIAGNOSIS — N182 Chronic kidney disease, stage 2 (mild): Secondary | ICD-10-CM | POA: Insufficient documentation

## 2014-12-26 DIAGNOSIS — R400 Somnolence: Secondary | ICD-10-CM

## 2014-12-26 DIAGNOSIS — E8809 Other disorders of plasma-protein metabolism, not elsewhere classified: Secondary | ICD-10-CM | POA: Insufficient documentation

## 2014-12-26 DIAGNOSIS — Z823 Family history of stroke: Secondary | ICD-10-CM | POA: Insufficient documentation

## 2014-12-26 DIAGNOSIS — R2230 Localized swelling, mass and lump, unspecified upper limb: Secondary | ICD-10-CM | POA: Insufficient documentation

## 2014-12-26 NOTE — Progress Notes (Signed)
   Subjective:    Patient ID: Brent Morrison, male    DOB: [birth year], 81 y.o.   MRN: [id number]  HPI Pt presents to the clinic with his sister. She is confused and wants to make sure about all of his diagnosis. Pt recently had CMP done and wants to go over results.   Sister is concerned with patient having spells of confusion and sleepiness. Today is a good day but he will have days that he does not make sense. There is a strong family hx of stroke and carotid stenosis. She wonders if he has been checked. Does not remember any speech slurred, extremity weakness, or facial droping.   Pt has noticed a painful bump in the palm of right hand. Noticed for last couple of days. No trauma. Not noticing getting bigger. Not done anything to make better or worse.    Review of Systems  All other systems reviewed and are negative.      Objective:   Physical Exam  Constitutional: He is oriented to person, place, and time. He appears well-developed and well-nourished.  HENT:  Head: Normocephalic and atraumatic.  Eyes: Conjunctivae and EOM are normal. Pupils are equal, round, and reactive to light.  Cardiovascular: Normal rate, regular rhythm and normal heart sounds.   Pulmonary/Chest: Effort normal and breath sounds normal. He has no wheezes.  Musculoskeletal:  Strength 4/5 of lower and upper extremities.   Neurological: He is alert and oriented to person, place, and time.  Gait is unstable but due to posture.   Skin: Skin is dry.  Palm of right hand 40mm by 71mm barely palpable lump that is tender to palpation.   Psychiatric: He has a normal mood and affect. His behavior is normal.          Assessment & Plan:  CKD II/hx of CKD III- GFR has improved to stage II. Explained dx with patient and sister. Gave a kidney diet to help keep patient healthy. Follow up every 3 months for recheck.   Decreased albumin- discuss a nutrition issue. Encouraged patient to increase eating and gave a gout and  kidney diet. Encouraged patient to drink 2 ensures a day.    Family hx of stroke/confusion/somnolence- will check lipids. Will order carotid dopplers. Follow up as directed after testing. Certainly is possible patient is having some TIA's. BP rechecked and under 150/90, checking lipids, does not have DM. Encouraged pt to start baby ASA daily.   Nodule of right palm- unclear etiology. Not visible to eye. Barely can palpate. Discussed icing and watching. Could be a calcium deposit on tendon or even a ganglion cyst. Follow up if not improving in 2 weeks or increasing in size.

## 2014-12-27 ENCOUNTER — Telehealth: Payer: Self-pay | Admitting: *Deleted

## 2014-12-27 NOTE — Telephone Encounter (Signed)
Pt's sister called to give you the dates of his echo & carotid US.  Echo was on 01/02/14 & the carotid scan was done on 08/07/13 and both were done at the [facility].

## 2014-12-31 ENCOUNTER — Telehealth: Payer: Self-pay | Admitting: Physician Assistant

## 2014-12-31 NOTE — Telephone Encounter (Signed)
Martin Majestic called to state the Pt reports having a hard time urinating and wants to know if an Rx can be called in. Advised Pt to go over to UC or contact his urologist if he does not begin urinating. Martin Majestic can not drive due to surgery and Pt is legally blind so he cannot drive. Informed if Pt does not start urinating on his own he would have to visit the ED if he couldn't get an appt with his urologist. Verbalized understating. Will route to PCP for review.

## 2015-01-01 ENCOUNTER — Ambulatory Visit (INDEPENDENT_AMBULATORY_CARE_PROVIDER_SITE_OTHER): Payer: Medicare Other | Admitting: Physician Assistant

## 2015-01-01 ENCOUNTER — Encounter: Payer: Self-pay | Admitting: Physician Assistant

## 2015-01-01 VITALS — BP 150/87 | HR 79 | Ht 70.0 in | Wt 163.0 lb

## 2015-01-01 DIAGNOSIS — N401 Enlarged prostate with lower urinary tract symptoms: Secondary | ICD-10-CM | POA: Diagnosis not present

## 2015-01-01 DIAGNOSIS — R339 Retention of urine, unspecified: Secondary | ICD-10-CM | POA: Diagnosis not present

## 2015-01-01 DIAGNOSIS — R319 Hematuria, unspecified: Secondary | ICD-10-CM

## 2015-01-01 DIAGNOSIS — N138 Other obstructive and reflux uropathy: Secondary | ICD-10-CM

## 2015-01-01 LAB — POCT URINALYSIS DIPSTICK
Bilirubin, UA: NEGATIVE
Glucose, UA: NEGATIVE
KETONES UA: NEGATIVE
Leukocytes, UA: NEGATIVE
Nitrite, UA: NEGATIVE
PROTEIN UA: NEGATIVE
Urobilinogen, UA: 0.2
pH, UA: 5

## 2015-01-01 MED ORDER — FINASTERIDE 5 MG PO TABS: 5.0000 mg | ORAL_TABLET | Freq: Every day | ORAL | Status: DC

## 2015-01-01 NOTE — Telephone Encounter (Signed)
Pt is here for acute office visit today.

## 2015-01-02 LAB — PSA: PSA: 0.85 ng/mL (ref <=4.00)

## 2015-01-02 LAB — BASIC METABOLIC PANEL WITH GFR
BUN: 16 mg/dL (ref 7–25)
CO2: 25 mmol/L (ref 20–31)
Calcium: 8.9 mg/dL (ref 8.6–10.3)
Chloride: 107 mmol/L (ref 98–110)
Creat: 1.05 mg/dL (ref 0.70–1.11)
GFR, EST AFRICAN AMERICAN: 77 mL/min (ref >=60)
GFR, EST NON AFRICAN AMERICAN: 67 mL/min (ref >=60)
Glucose, Bld: 87 mg/dL (ref 65–99)
POTASSIUM: 4.6 mmol/L (ref 3.5–5.3)
Sodium: 142 mmol/L (ref 135–146)

## 2015-01-02 LAB — URINALYSIS, MICROSCOPIC ONLY
BACTERIA UA: NONE SEEN [HPF]
Casts: NONE SEEN [LPF]
Crystals: NONE SEEN [HPF]
RBC / HPF: NONE SEEN RBC/HPF (ref <=2)
SQUAMOUS EPITHELIAL / LPF: NONE SEEN [HPF] (ref <=5)
WBC UA: NONE SEEN WBC/HPF (ref <=5)
Yeast: NONE SEEN [HPF]

## 2015-01-02 NOTE — Progress Notes (Signed)
   Subjective:    Patient ID: Brent Morrison, male    DOB: 03/25/1933, 81 y.o.   MRN: [id number]  HPI Pt is a 81 yo male with a hx of BPH with obstruction who presents to the clinic with decreased urination. She has noticed decrease for last week or so. Yesterday he went all day and only urinated once about a "glass full". He has not urinated today. No abdominal pain or pressure. No dysuria. He admits he stopped any form of prostate medication a while back because he thought he was doing good. Last CMP was done 8/12 about a month ago and kidney function was stable.    Review of Systems  All other systems reviewed and are negative.      Objective:   Physical Exam  Constitutional: He is oriented to person, place, and time. He appears well-developed and well-nourished.  HENT:  Head: Normocephalic and atraumatic.  Cardiovascular: Normal rate, regular rhythm and normal heart sounds.   Pulmonary/Chest: Effort normal and breath sounds normal.  Abdominal: Soft. Bowel sounds are normal. He exhibits no distension and no mass. There is no tenderness. There is no rebound and no guarding.  Neurological: He is alert and oriented to person, place, and time.  Skin: Skin is dry.  1/2 pitting edema bilateral lower legs.   Psychiatric: He has a normal mood and affect. His behavior is normal.          Assessment & Plan:  Urinary retention/BPH with obstruction- catherization done today with 110cc of fluid. Will recheck labs PSA and BMP. Restarted finesteride due to possible sulfa allergy with flomax. If no urination in next 12-24 please go to ER. Pt cannot remember past urologist or even if had one. I will try to find this out and get a follow up appt for patient.  UA dipstick- showed blood with culture and get mircoscopic. Likely blood due to catherization.   Spent 30 minutes with patient and greater than 50 percent of visit spent counseling patient to stay on prostate medication.

## 2015-01-03 LAB — URINE CULTURE
COLONY COUNT: NO GROWTH
Organism ID, Bacteria: NO GROWTH

## 2015-01-06 DIAGNOSIS — N281 Cyst of kidney, acquired: Secondary | ICD-10-CM | POA: Diagnosis not present

## 2015-01-06 DIAGNOSIS — N403 Nodular prostate with lower urinary tract symptoms: Secondary | ICD-10-CM | POA: Diagnosis not present

## 2015-01-06 DIAGNOSIS — R338 Other retention of urine: Secondary | ICD-10-CM | POA: Diagnosis not present

## 2015-02-06 DIAGNOSIS — N401 Enlarged prostate with lower urinary tract symptoms: Secondary | ICD-10-CM | POA: Diagnosis not present

## 2015-02-20 ENCOUNTER — Other Ambulatory Visit: Payer: Self-pay | Admitting: Physician Assistant

## 2015-02-20 NOTE — Progress Notes (Signed)
Have pt come in to do Mini mental status and can send down stairs to do CT scan same day.

## 2015-02-20 NOTE — Progress Notes (Signed)
Spoke with [employer] and she is going to get with pt and see what his availability is.

## 2015-03-24 ENCOUNTER — Other Ambulatory Visit: Payer: Self-pay | Admitting: Physician Assistant

## 2015-03-25 ENCOUNTER — Other Ambulatory Visit: Payer: Self-pay | Admitting: *Deleted

## 2015-03-25 MED ORDER — FINASTERIDE 5 MG PO TABS: 5.0000 mg | ORAL_TABLET | Freq: Every day | ORAL | Status: DC

## 2015-03-26 DIAGNOSIS — H348192 Central retinal vein occlusion, unspecified eye, stable: Secondary | ICD-10-CM | POA: Diagnosis not present

## 2015-03-26 DIAGNOSIS — H33023 Retinal detachment with multiple breaks, bilateral: Secondary | ICD-10-CM | POA: Diagnosis not present

## 2015-03-26 DIAGNOSIS — H179 Unspecified corneal scar and opacity: Secondary | ICD-10-CM | POA: Diagnosis not present

## 2015-03-26 DIAGNOSIS — B0052 Herpesviral keratitis: Secondary | ICD-10-CM | POA: Diagnosis not present

## 2015-03-26 DIAGNOSIS — Z961 Presence of intraocular lens: Secondary | ICD-10-CM | POA: Diagnosis not present

## 2015-03-26 DIAGNOSIS — H548 Legal blindness, as defined in USA: Secondary | ICD-10-CM | POA: Diagnosis not present

## 2015-03-28 ENCOUNTER — Encounter: Payer: Self-pay | Admitting: Physician Assistant

## 2015-03-28 ENCOUNTER — Ambulatory Visit (INDEPENDENT_AMBULATORY_CARE_PROVIDER_SITE_OTHER): Payer: Medicare Other | Admitting: Physician Assistant

## 2015-03-28 ENCOUNTER — Ambulatory Visit (INDEPENDENT_AMBULATORY_CARE_PROVIDER_SITE_OTHER): Payer: Medicare Other

## 2015-03-28 VITALS — BP 154/82 | HR 78 | Ht 70.0 in | Wt 170.0 lb

## 2015-03-28 DIAGNOSIS — F05 Delirium due to known physiological condition: Principal | ICD-10-CM

## 2015-03-28 DIAGNOSIS — I679 Cerebrovascular disease, unspecified: Secondary | ICD-10-CM | POA: Diagnosis not present

## 2015-03-28 DIAGNOSIS — E039 Hypothyroidism, unspecified: Secondary | ICD-10-CM | POA: Diagnosis not present

## 2015-03-28 DIAGNOSIS — R413 Other amnesia: Secondary | ICD-10-CM

## 2015-03-28 DIAGNOSIS — G930 Cerebral cysts: Secondary | ICD-10-CM | POA: Diagnosis not present

## 2015-03-28 DIAGNOSIS — I10 Essential (primary) hypertension: Secondary | ICD-10-CM | POA: Diagnosis not present

## 2015-03-28 DIAGNOSIS — R410 Disorientation, unspecified: Secondary | ICD-10-CM

## 2015-03-28 MED ORDER — THYROID 15 MG PO TABS: ORAL_TABLET | ORAL | Status: DC

## 2015-03-28 MED ORDER — THYROID 30 MG PO TABS: ORAL_TABLET | ORAL | Status: DC

## 2015-03-28 NOTE — Progress Notes (Signed)
   Subjective:    Patient ID: Brent Morrison, male    DOB: Oct 21, 1932, 81 y.o.   MRN: [id number]  HPI  Patient is an 81 year old male who presents to the clinic with his sister. The sister is concerned with his on and off again memory loss and confusional state. She has noticed this for the last 3 months. She usually notices that it happens in the mornings when he first gets up. She went to his house one morning and he was packing wet close into his suitcase and talking about leaving the mobil home in [place]. NOne of this information was true. Pt sister denies noticing any slurred speech or unilateral muscle weakness or lack. He is legally blind and has always been able to recognize his sister by voice. That has not changed. His memory loss comes more with long-term memory and short-term memory when he is not confused he is very sharp per patient sister. Patient has a history of chronic kidney disease as well as frequent urinary tract infections. He denies any symptoms today.    Review of Systems  All other systems reviewed and are negative.      Objective:   Physical Exam  Constitutional: He is oriented to person, place, and time. He appears well-developed and well-nourished.  HENT:  Head: Normocephalic and atraumatic.  Cardiovascular: Normal rate, regular rhythm and normal heart sounds.   Pulmonary/Chest: Effort normal and breath sounds normal.  Neurological: He is alert and oriented to person, place, and time.  Skin: Skin is dry.  Psychiatric: He has a normal mood and affect. His behavior is normal.          Assessment & Plan:  Subacute confusion/memory loss-unclear etiology at this point. I did order some comprehensive labs to look for any causes of acute confusion. I also ordered a urine dipstick to look for any signs of infection. He is not exactly presenting like Alzheimer's since he's having more confusion in the morning and not the afternoon and evening hours. This could  be a form of dementia. Discussed how there are medications to delay the process but nothing to cure. His Mini-Mental status exam today was reassuring incompletely normal on every question that did not require him to read.thus 27/27. Reassured patient I did not see any signs of mini strokes. He is not having any slurred speech or unilateral muscle weakness. Will get CT of brain today. I do not find anything in labs(metabolic issues/infection or imaging we will can consider Aricept.  Hypothyroidism- we'll check thyroid levels today and adjust medication accordingly. I did print off refills today for patient to get since he is completely out.  HTN- borderline blood pressure today. Will not make any changes today. Goal for his age is 150/90. Follow-up in 1-2 months for recheck or sooner if needed.

## 2015-03-29 LAB — URINALYSIS
Bilirubin Urine: NEGATIVE
Glucose, UA: NEGATIVE
HGB URINE DIPSTICK: NEGATIVE
Ketones, ur: NEGATIVE
LEUKOCYTES UA: NEGATIVE
Nitrite: NEGATIVE
PROTEIN: NEGATIVE
Specific Gravity, Urine: 1.017 (ref 1.001–1.035)
pH: 6 (ref 5.0–8.0)

## 2015-03-29 LAB — CBC WITH DIFFERENTIAL/PLATELET
BASOS ABS: 0 10*3/uL (ref 0.0–0.1)
BASOS PCT: 0 % (ref 0–1)
EOS ABS: 0 10*3/uL (ref 0.0–0.7)
EOS PCT: 0 % (ref 0–5)
HCT: 40.5 % (ref 39.0–52.0)
Hemoglobin: 13.4 g/dL (ref 13.0–17.0)
Lymphocytes Relative: 11 % — ABNORMAL LOW (ref 12–46)
Lymphs Abs: 0.7 10*3/uL (ref 0.7–4.0)
MCH: 31.1 pg (ref 26.0–34.0)
MCHC: 33.1 g/dL (ref 30.0–36.0)
MCV: 94 fL (ref 78.0–100.0)
MPV: 11.4 fL (ref 8.6–12.4)
Monocytes Absolute: 0.7 10*3/uL (ref 0.1–1.0)
Monocytes Relative: 11 % (ref 3–12)
NEUTROS PCT: 78 % — AB (ref 43–77)
Neutro Abs: 4.7 10*3/uL (ref 1.7–7.7)
PLATELETS: 227 10*3/uL (ref 150–400)
RBC: 4.31 MIL/uL (ref 4.22–5.81)
RDW: 13 % (ref 11.5–15.5)
WBC: 6 10*3/uL (ref 4.0–10.5)

## 2015-03-29 LAB — COMPLETE METABOLIC PANEL WITH GFR
ALBUMIN: 3.5 g/dL — AB (ref 3.6–5.1)
ALK PHOS: 55 U/L (ref 40–115)
ALT: 12 U/L (ref 9–46)
AST: 13 U/L (ref 10–35)
BILIRUBIN TOTAL: 0.4 mg/dL (ref 0.2–1.2)
BUN: 18 mg/dL (ref 7–25)
CALCIUM: 8.5 mg/dL — AB (ref 8.6–10.3)
CO2: 29 mmol/L (ref 20–31)
CREATININE: 1.1 mg/dL (ref 0.70–1.11)
Chloride: 105 mmol/L (ref 98–110)
GFR, EST AFRICAN AMERICAN: 73 mL/min (ref >=60)
GFR, EST NON AFRICAN AMERICAN: 63 mL/min (ref >=60)
Glucose, Bld: 81 mg/dL (ref 65–99)
Potassium: 4.2 mmol/L (ref 3.5–5.3)
Sodium: 142 mmol/L (ref 135–146)
TOTAL PROTEIN: 5.6 g/dL — AB (ref 6.1–8.1)

## 2015-03-29 LAB — SEDIMENTATION RATE: SED RATE: 1 mm/h (ref 0–20)

## 2015-03-29 LAB — TSH: TSH: 1.038 u[IU]/mL (ref 0.350–4.500)

## 2015-03-29 LAB — VITAMIN B12: Vitamin B-12: 340 pg/mL (ref 211–911)

## 2015-04-01 ENCOUNTER — Encounter: Payer: Self-pay | Admitting: Physician Assistant

## 2015-04-01 DIAGNOSIS — F0150 Vascular dementia without behavioral disturbance: Secondary | ICD-10-CM | POA: Insufficient documentation

## 2015-04-21 ENCOUNTER — Telehealth: Payer: Self-pay | Admitting: Physician Assistant

## 2015-04-21 DIAGNOSIS — R410 Disorientation, unspecified: Secondary | ICD-10-CM | POA: Diagnosis not present

## 2015-04-21 DIAGNOSIS — I10 Essential (primary) hypertension: Secondary | ICD-10-CM | POA: Diagnosis not present

## 2015-04-21 DIAGNOSIS — Z87891 Personal history of nicotine dependence: Secondary | ICD-10-CM | POA: Diagnosis not present

## 2015-04-21 DIAGNOSIS — E039 Hypothyroidism, unspecified: Secondary | ICD-10-CM | POA: Diagnosis not present

## 2015-04-21 NOTE — Telephone Encounter (Signed)
[name] called in Urgent Care today about his BP running high. I told her that there was nothing I could do until tomorrow. She wanted to see if someone could contact her tomorrow.  Thanks

## 2015-04-22 ENCOUNTER — Inpatient Hospital Stay: Payer: Medicare Other | Admitting: Physician Assistant

## 2015-04-22 NOTE — Telephone Encounter (Signed)
Patient has an appointment scheduled.  °

## 2015-04-22 NOTE — Telephone Encounter (Signed)
appt was canceled. Call jewel and see what is going on. Where did he go? Not in care everywhere or our EMR.

## 2015-04-23 ENCOUNTER — Encounter: Payer: Self-pay | Admitting: Physician Assistant

## 2015-04-23 ENCOUNTER — Ambulatory Visit (INDEPENDENT_AMBULATORY_CARE_PROVIDER_SITE_OTHER): Payer: Medicare Other | Admitting: Physician Assistant

## 2015-04-23 VITALS — BP 154/66 | HR 72 | Ht 70.0 in | Wt 164.0 lb

## 2015-04-23 DIAGNOSIS — Z13220 Encounter for screening for lipoid disorders: Secondary | ICD-10-CM | POA: Diagnosis not present

## 2015-04-23 DIAGNOSIS — G459 Transient cerebral ischemic attack, unspecified: Secondary | ICD-10-CM

## 2015-04-23 DIAGNOSIS — I10 Essential (primary) hypertension: Secondary | ICD-10-CM | POA: Diagnosis not present

## 2015-04-23 DIAGNOSIS — F0150 Vascular dementia without behavioral disturbance: Secondary | ICD-10-CM

## 2015-04-23 LAB — LIPID PANEL
CHOL/HDL RATIO: 2.8 ratio (ref <=5.0)
Cholesterol: 165 mg/dL (ref 125–200)
HDL: 60 mg/dL (ref >=40)
LDL CALC: 94 mg/dL (ref <130)
Triglycerides: 53 mg/dL (ref <150)
VLDL: 11 mg/dL (ref <30)

## 2015-04-23 MED ORDER — LOSARTAN POTASSIUM-HCTZ 50-12.5 MG PO TABS: 1.0000 | ORAL_TABLET | Freq: Every day | ORAL | Status: DC

## 2015-04-23 NOTE — Progress Notes (Signed)
   Subjective:    Patient ID: Brent Morrison, male    DOB: 04-22-1933, 81 y.o.   MRN: [id number]  HPI  Pt is a 81 yo male who presents to the clinic with his sister. She took him to the ER on 04/21/2015 for elevated blood pressure and confusion. He was sent home with HCTZ for blood pressure. Pt did not take HCTZ because of hx of hypokalemia. BP at home was reported to be 200/100 however BP in ER was 160/85. Denies any CP, palpittions, headaches, or dizziness. He is legally blind. He had a recent CT scan that showed vascular changes. Pt has periods of confusion but not continual.     Review of Systems  All other systems reviewed and are negative.      Objective:   Physical Exam  Constitutional: He is oriented to person, place, and time. He appears well-developed and well-nourished.  HENT:  Head: Normocephalic and atraumatic.  Cardiovascular: Normal rate, regular rhythm and normal heart sounds.   Pulmonary/Chest: Effort normal and breath sounds normal.  Neurological: He is alert and oriented to person, place, and time.  Psychiatric: He has a normal mood and affect. His behavior is normal.          Assessment & Plan:  HTN- restarted losartan/HCTZ 50/12.5mg  daily. Will check bmp in next month. Recheck bP in one month.   Vascular dementia- plan is to make sure BP, sugars, cholesterol all stays in normal range. Lipid level ordered. ASA 325mg  started. Discussed home health to help him do some medication management and evaluation for any needs pt might have to keep him healthy and safe at hom. Pt declined any extra help.

## 2015-04-25 ENCOUNTER — Encounter: Payer: Self-pay | Admitting: Physician Assistant

## 2015-04-29 ENCOUNTER — Telehealth: Payer: Self-pay

## 2015-04-29 NOTE — Telephone Encounter (Signed)
Patient aware of lab results.  Patient is still concerned about his blood pressure.  He takes his medication in the morning.  He says the bottom number was 93 about 5pm.  He would like to know if he should be taking something else as well.  Please advise.

## 2015-04-29 NOTE — Telephone Encounter (Signed)
Take 2 tablets of losartan/HCTZ to equal 100/25mg  daily and give me a report on BP in one week.

## 2015-04-29 NOTE — Telephone Encounter (Signed)
-----   Message from Donella Stade, [place] sent at 04/23/2015 10:38 PM EST ----- Call pt: cholesterol looks really good. LDL below 100 and HDL great.

## 2015-04-29 NOTE — Telephone Encounter (Signed)
Patient is still concerned about his blood pressure. He takes his medication in the morning. He says the bottom number was 93 about 5pm. He would like to know if he should be taking something else as well. Please advise

## 2015-04-30 NOTE — Telephone Encounter (Signed)
Patient informed to increast losartan/hctz to 2 tablets, he will follow up in a week with a call report of blood pressure readings.

## 2015-05-21 ENCOUNTER — Other Ambulatory Visit: Payer: Self-pay | Admitting: Physician Assistant

## 2015-05-21 MED ORDER — LOSARTAN POTASSIUM-HCTZ 50-12.5 MG PO TABS: 1.0000 | ORAL_TABLET | Freq: Every day | ORAL | Status: DC

## 2015-06-16 ENCOUNTER — Other Ambulatory Visit: Payer: Self-pay | Admitting: Physician Assistant

## 2015-06-18 ENCOUNTER — Other Ambulatory Visit: Payer: Self-pay | Admitting: Physician Assistant

## 2015-06-18 DIAGNOSIS — R490 Dysphonia: Secondary | ICD-10-CM | POA: Diagnosis not present

## 2015-06-18 DIAGNOSIS — R338 Other retention of urine: Secondary | ICD-10-CM | POA: Diagnosis not present

## 2015-06-18 DIAGNOSIS — H6123 Impacted cerumen, bilateral: Secondary | ICD-10-CM | POA: Diagnosis not present

## 2015-06-18 DIAGNOSIS — R3911 Hesitancy of micturition: Secondary | ICD-10-CM | POA: Diagnosis not present

## 2015-06-18 DIAGNOSIS — N400 Enlarged prostate without lower urinary tract symptoms: Secondary | ICD-10-CM | POA: Diagnosis not present

## 2015-06-18 MED ORDER — LOSARTAN POTASSIUM-HCTZ 50-12.5 MG PO TABS: ORAL_TABLET | ORAL | Status: DC

## 2015-07-04 ENCOUNTER — Encounter: Payer: Self-pay | Admitting: *Deleted

## 2015-07-17 DIAGNOSIS — L570 Actinic keratosis: Secondary | ICD-10-CM | POA: Diagnosis not present

## 2015-07-17 DIAGNOSIS — L820 Inflamed seborrheic keratosis: Secondary | ICD-10-CM | POA: Diagnosis not present

## 2015-08-13 IMAGING — CT CT ABD-PELV W/O CM
1 of 2 series · 15 of 32 positions shown, 19 images · non-contrast
Comparison: None.

CLINICAL DATA: Initial encounter for all generalized abdominal pain
with constipation dysuria for about 2 months. Left-sided rib pain
for 3 days.

EXAM:
CT ABDOMEN AND PELVIS WITHOUT CONTRAST
TECHNIQUE: Multidetector CT imaging of the abdomen and pelvis was performed
following the standard protocol without IV contrast.

[Series 2: abd/pel w/o · axial · non-contrast · 0.74mm/px · z∈[+1069,+1474]mm · 15 of 89 slices shown, 19 images]
[im 4/89  soft-tissue]
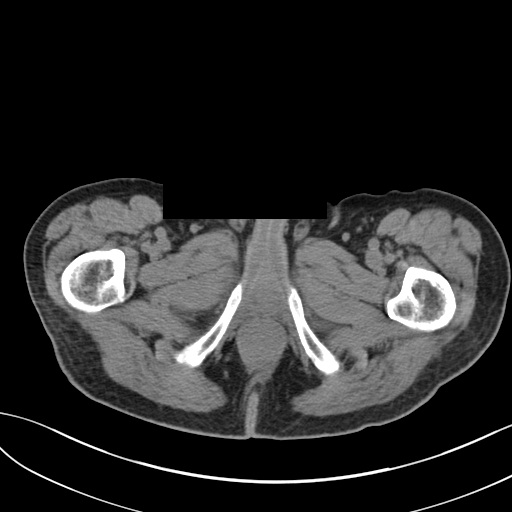
[im 4/89  bone]
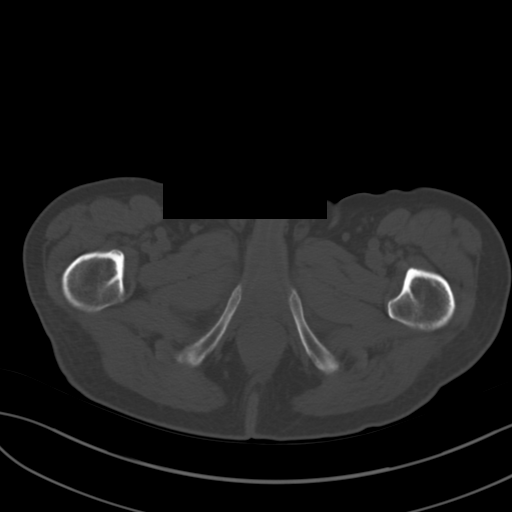
[im 12/89  soft-tissue]
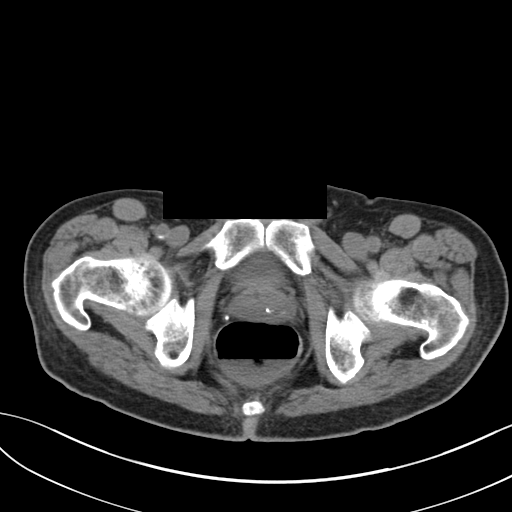
[im 19/89  soft-tissue]
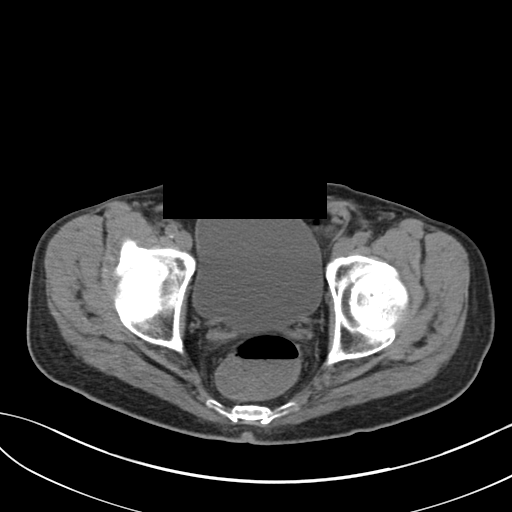
[im 26/89  soft-tissue]
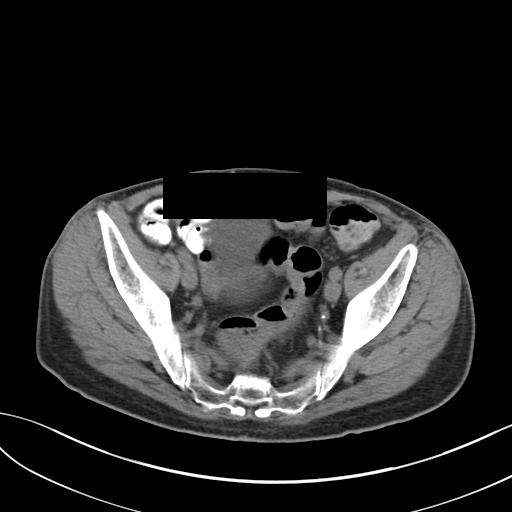
[im 30/89  soft-tissue]
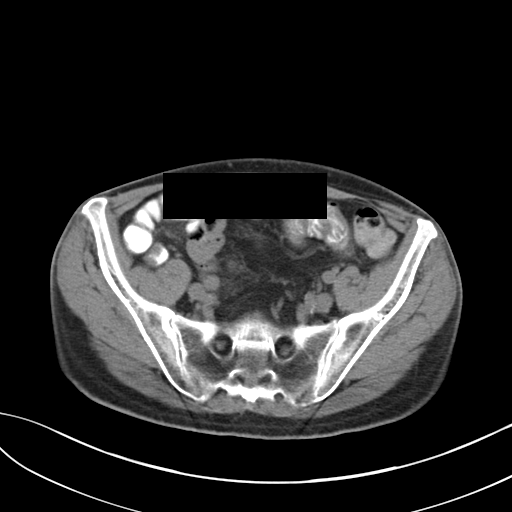
[im 37/89  soft-tissue]
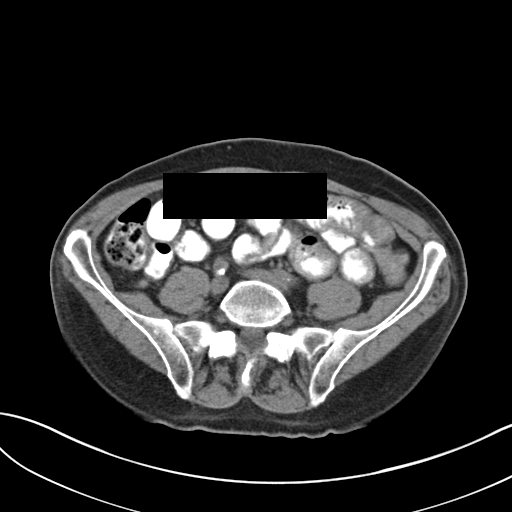
[im 45/89  soft-tissue]
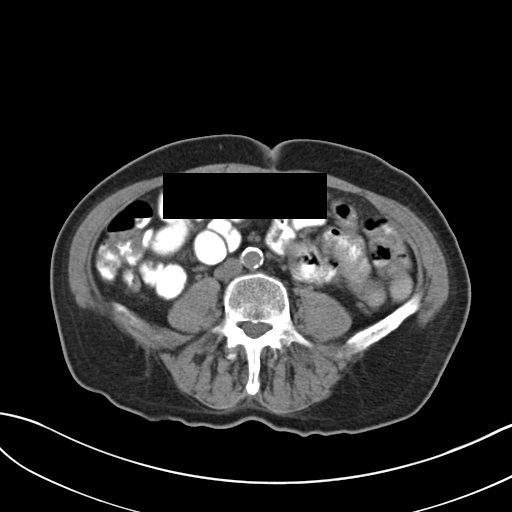
[im 52/89  soft-tissue]
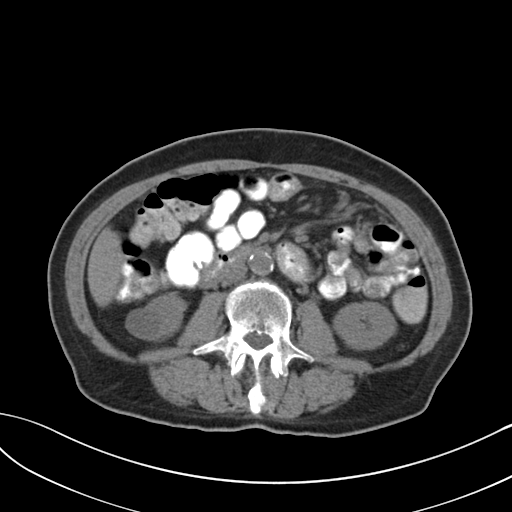
[im 59/89  soft-tissue]
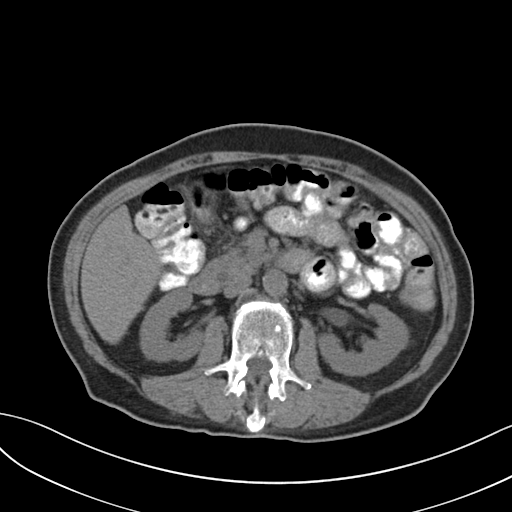
[im 59/89  bone]
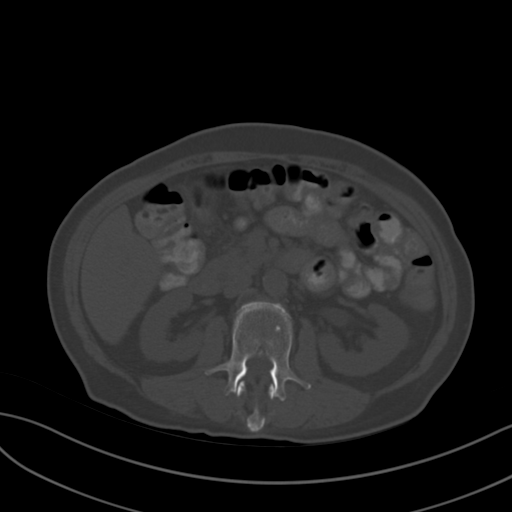
[im 63/89  soft-tissue]
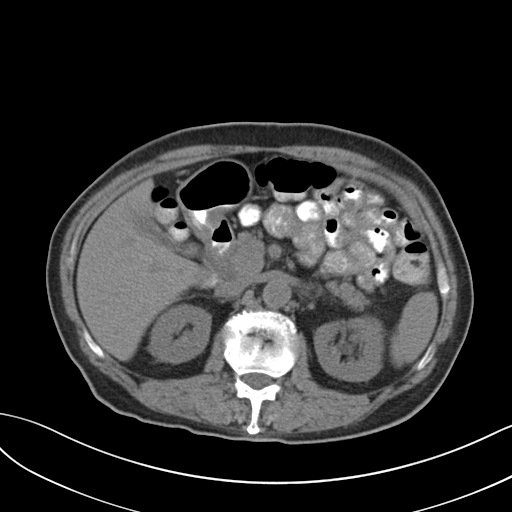
[im 70/89  soft-tissue]
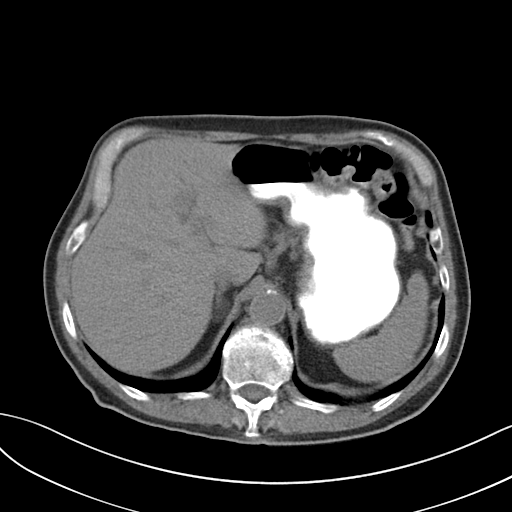
[im 74/89  lung]
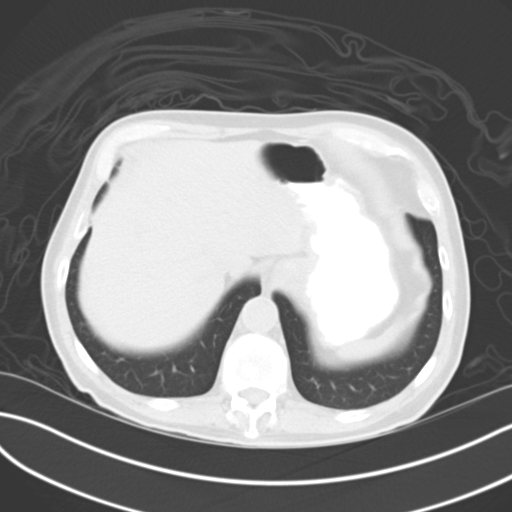
[im 78/89  soft-tissue]
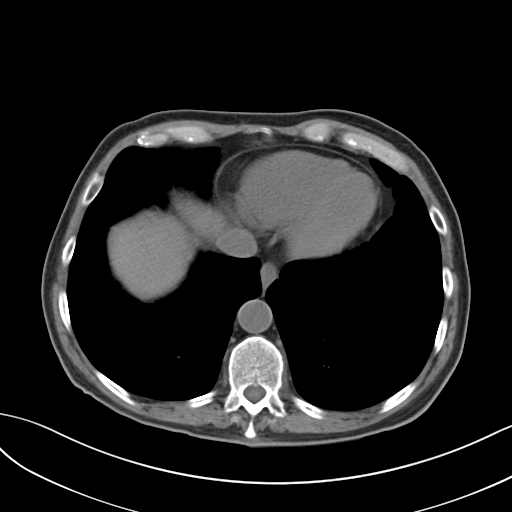
[im 78/89  lung]
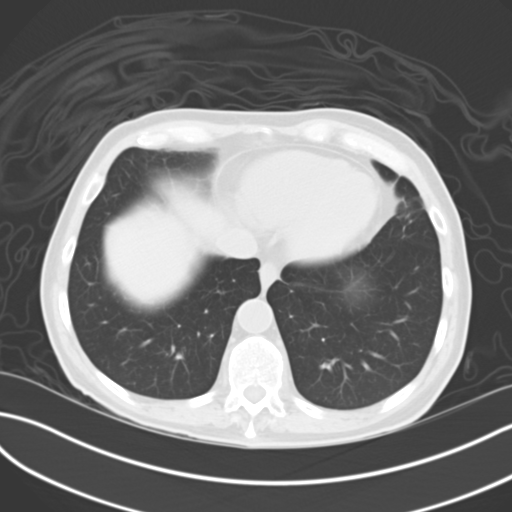
[im 81/89  lung]
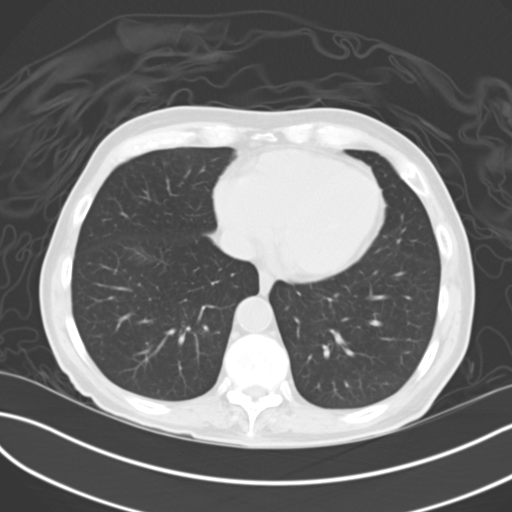
[im 85/89  soft-tissue]
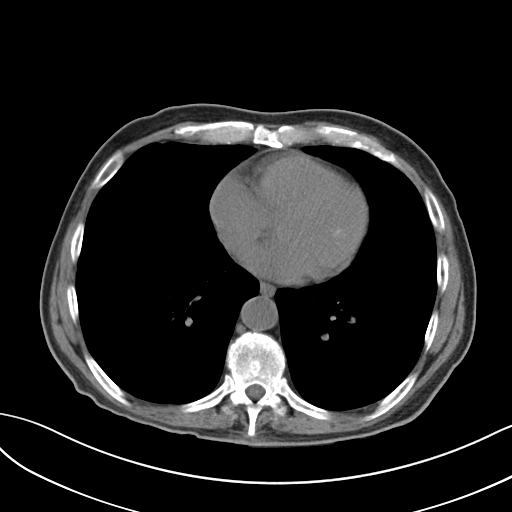
[im 85/89  lung]
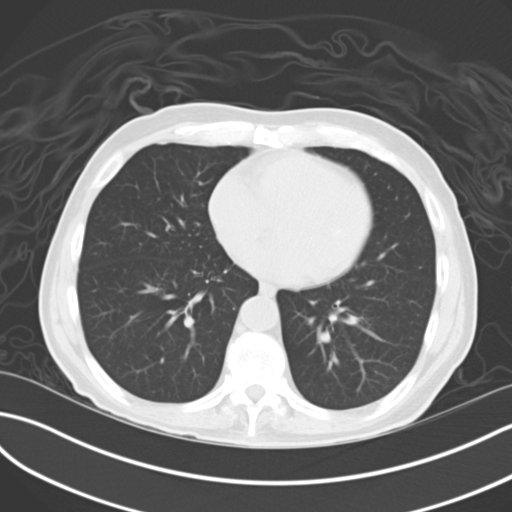

[15 of 32 positions shown; findings below may reference images not displayed]

FINDINGS: Lower chest:  Unremarkable.

Hepatobiliary: No focal abnormality in the liver on this study
without intravenous contrast. No evidence for hepatomegaly.
Gallbladder is decompressed. No intrahepatic or extrahepatic biliary
dilation.

Pancreas: No focal mass lesion. No dilatation of the main duct. No
intraparenchymal cyst. No peripancreatic edema.

Spleen: No splenomegaly. No focal mass lesion.

Adrenals/Urinary Tract: No adrenal nodule or mass. 2.5 cm water
density lesion in the lower pole of the right kidney is likely a
cyst. No stones are seen in either kidney. No ureteral or bladder
stones. No secondary changes in either kidney or ureter. Bladder is
distended but otherwise unremarkable.

Stomach/Bowel: Stomach is nondistended. No gastric wall thickening.
No evidence of outlet obstruction. Duodenum is normally positioned
as is the ligament of Treitz. No small bowel wall thickening. No
small bowel dilatation. Terminal ileum is normal. The appendix is
normal. No gross colonic mass. No colonic wall thickening. No
substantial diverticular change.

Vascular/Lymphatic: Atherosclerotic calcification is noted in the
wall of the abdominal aorta without aneurysm. No gastrohepatic or
hepatoduodenal ligament lymphadenopathy. No retroperitoneal
lymphadenopathy. No pelvic sidewall lymphadenopathy.

Reproductive: Probable TURP defect in the central prostate gland.
Seminal vesicles are unremarkable.

Other: No intraperitoneal free fluid.

Musculoskeletal: Bone windows reveal no worrisome lytic or sclerotic
osseous lesions.
IMPRESSION: No acute findings in the abdomen or pelvis. Specifically, no
findings to explain the patient's history of generalized abdominal
pain.

## 2015-09-15 ENCOUNTER — Other Ambulatory Visit: Payer: Self-pay | Admitting: Physician Assistant

## 2015-12-19 ENCOUNTER — Other Ambulatory Visit: Payer: Self-pay | Admitting: Physician Assistant

## 2015-12-22 ENCOUNTER — Ambulatory Visit (INDEPENDENT_AMBULATORY_CARE_PROVIDER_SITE_OTHER): Payer: Medicare Other | Admitting: Physician Assistant

## 2015-12-22 ENCOUNTER — Other Ambulatory Visit: Payer: Self-pay | Admitting: *Deleted

## 2015-12-22 ENCOUNTER — Ambulatory Visit (INDEPENDENT_AMBULATORY_CARE_PROVIDER_SITE_OTHER): Payer: Medicare Other

## 2015-12-22 ENCOUNTER — Encounter: Payer: Self-pay | Admitting: Physician Assistant

## 2015-12-22 VITALS — BP 119/63 | HR 73 | Ht 70.0 in | Wt 171.0 lb

## 2015-12-22 DIAGNOSIS — R059 Cough, unspecified: Secondary | ICD-10-CM

## 2015-12-22 DIAGNOSIS — I700 Atherosclerosis of aorta: Secondary | ICD-10-CM

## 2015-12-22 DIAGNOSIS — N401 Enlarged prostate with lower urinary tract symptoms: Secondary | ICD-10-CM

## 2015-12-22 DIAGNOSIS — J449 Chronic obstructive pulmonary disease, unspecified: Secondary | ICD-10-CM

## 2015-12-22 DIAGNOSIS — I10 Essential (primary) hypertension: Secondary | ICD-10-CM | POA: Diagnosis not present

## 2015-12-22 DIAGNOSIS — R05 Cough: Secondary | ICD-10-CM

## 2015-12-22 DIAGNOSIS — N138 Other obstructive and reflux uropathy: Secondary | ICD-10-CM

## 2015-12-22 DIAGNOSIS — N182 Chronic kidney disease, stage 2 (mild): Secondary | ICD-10-CM

## 2015-12-22 DIAGNOSIS — E039 Hypothyroidism, unspecified: Secondary | ICD-10-CM

## 2015-12-22 DIAGNOSIS — J984 Other disorders of lung: Secondary | ICD-10-CM | POA: Insufficient documentation

## 2015-12-22 DIAGNOSIS — Z23 Encounter for immunization: Secondary | ICD-10-CM | POA: Diagnosis not present

## 2015-12-22 DIAGNOSIS — I872 Venous insufficiency (chronic) (peripheral): Secondary | ICD-10-CM

## 2015-12-22 MED ORDER — FUROSEMIDE 20 MG PO TABS: 20.0000 mg | ORAL_TABLET | Freq: Every day | ORAL | 1 refills | Status: DC

## 2015-12-22 MED ORDER — ARMOUR THYROID 15 MG PO TABS: 15.0000 mg | ORAL_TABLET | Freq: Every day | ORAL | 1 refills | Status: DC

## 2015-12-22 MED ORDER — FINASTERIDE 5 MG PO TABS: 5.0000 mg | ORAL_TABLET | Freq: Every day | ORAL | 1 refills | Status: DC

## 2015-12-22 MED ORDER — OMEPRAZOLE 40 MG PO CPDR
40.0000 mg | DELAYED_RELEASE_CAPSULE | Freq: Every day | ORAL | 3 refills | Status: AC
Start: 2015-12-22 — End: ?

## 2015-12-22 MED ORDER — LEVOFLOXACIN 750 MG PO TABS: 750.0000 mg | ORAL_TABLET | Freq: Every day | ORAL | 0 refills | Status: AC

## 2015-12-22 MED ORDER — ARMOUR THYROID 30 MG PO TABS: 30.0000 mg | ORAL_TABLET | Freq: Every day | ORAL | 1 refills | Status: AC

## 2015-12-22 MED ORDER — LOSARTAN POTASSIUM-HCTZ 50-12.5 MG PO TABS: 2.0000 | ORAL_TABLET | Freq: Every day | ORAL | 1 refills | Status: DC

## 2015-12-22 MED ORDER — ALBUTEROL SULFATE HFA 108 (90 BASE) MCG/ACT IN AERS: 2.0000 | INHALATION_SPRAY | Freq: Four times a day (QID) | RESPIRATORY_TRACT | 2 refills | Status: AC | PRN

## 2015-12-22 MED ORDER — ARMOUR THYROID 15 MG PO TABS: 15.0000 mg | ORAL_TABLET | Freq: Every day | ORAL | 1 refills | Status: AC

## 2015-12-22 NOTE — Addendum Note (Signed)
Addended by: Donella Stade on: 12/22/2015 04:26 PM   Modules accepted: Orders

## 2015-12-22 NOTE — Progress Notes (Signed)
   Subjective:    Patient ID: Brent Morrison, male    DOB: September 30, 1932, 82 y.o.   MRN: [id number]  HPI Pt is a 82 yo male who presents to the clinic for 6 month follow up and medication refill.   Hypothyroidism- doing well. Needs refills.   HTN- denies any CP, palpitations, headaches, dizziness. Takes medication daily.   Pt continues to have some swelling of lower extremity. Compression stockings make legs itchy.   He does have an ongoing cough for last 6 months. Dry without production. No sinus pressure, ear pain or ST. He admits he is doing renovations at his home.    Review of Systems    see HPI.  Objective:   Physical Exam  Constitutional: He is oriented to person, place, and time. He appears well-developed and well-nourished.  HENT:  Head: Normocephalic and atraumatic.  Cardiovascular: Normal rate, regular rhythm and normal heart sounds.   Pulmonary/Chest: Effort normal and breath sounds normal.  Neurological: He is alert and oriented to person, place, and time.  Skin:  2+ pitting edema bilaterally.   Psychiatric: He has a normal mood and affect. His behavior is normal.          Assessment & Plan:  Hypothyroidism- pt request printed out rx. TSH ordered and will adjust accordingly.   HTN- controlled, hyzaar refilled for 6 months, printed.   CKD- CMP ordered today.   BPH- proscar refilled.   Chronic venous insuffiency/bilateral lower leg edema- as needed lasix 20mg . Discussed elevation and compression stockings. Limit salt intake.   Cough- unclear etiology.former smoker. Could be allergies but anti-histamines cause him not to be able to urinate. Will get CXR today. Could be a GERD component. Omeprazole sent to try for next month. Discussed allergy exposure, cleaning duct work in home, nasal saline washes etc.    Pt declined flu shot.  Prevnar 13 given today.

## 2015-12-22 NOTE — Progress Notes (Unsigned)
Rx sent 

## 2015-12-23 LAB — COMPLETE METABOLIC PANEL WITH GFR
ALBUMIN: 3.6 g/dL (ref 3.6–5.1)
ALK PHOS: 50 U/L (ref 40–115)
ALT: 11 U/L (ref 9–46)
AST: 13 U/L (ref 10–35)
BUN: 18 mg/dL (ref 7–25)
CALCIUM: 9.2 mg/dL (ref 8.6–10.3)
CO2: 30 mmol/L (ref 20–31)
CREATININE: 1.27 mg/dL — AB (ref 0.70–1.11)
Chloride: 104 mmol/L (ref 98–110)
GFR, EST AFRICAN AMERICAN: 61 mL/min (ref >=60)
GFR, Est Non African American: 53 mL/min — ABNORMAL LOW (ref >=60)
Glucose, Bld: 90 mg/dL (ref 65–99)
POTASSIUM: 3.9 mmol/L (ref 3.5–5.3)
Sodium: 141 mmol/L (ref 135–146)
Total Bilirubin: 0.5 mg/dL (ref 0.2–1.2)
Total Protein: 5.6 g/dL — ABNORMAL LOW (ref 6.1–8.1)

## 2015-12-23 LAB — TSH: TSH: 0.76 m[IU]/L (ref 0.40–4.50)

## 2015-12-26 ENCOUNTER — Other Ambulatory Visit: Payer: Self-pay

## 2015-12-26 DIAGNOSIS — H348132 Central retinal vein occlusion, bilateral, stable: Secondary | ICD-10-CM | POA: Diagnosis not present

## 2015-12-26 DIAGNOSIS — H33023 Retinal detachment with multiple breaks, bilateral: Secondary | ICD-10-CM | POA: Diagnosis not present

## 2015-12-26 DIAGNOSIS — H348192 Central retinal vein occlusion, unspecified eye, stable: Secondary | ICD-10-CM | POA: Diagnosis not present

## 2015-12-26 DIAGNOSIS — Z961 Presence of intraocular lens: Secondary | ICD-10-CM | POA: Diagnosis not present

## 2015-12-26 DIAGNOSIS — B0052 Herpesviral keratitis: Secondary | ICD-10-CM | POA: Diagnosis not present

## 2015-12-26 DIAGNOSIS — H179 Unspecified corneal scar and opacity: Secondary | ICD-10-CM | POA: Diagnosis not present

## 2015-12-26 DIAGNOSIS — R7989 Other specified abnormal findings of blood chemistry: Secondary | ICD-10-CM

## 2015-12-26 LAB — HM DIABETES EYE EXAM

## 2015-12-31 ENCOUNTER — Encounter: Payer: Self-pay | Admitting: Physician Assistant

## 2015-12-31 DIAGNOSIS — H348192 Central retinal vein occlusion, unspecified eye, stable: Secondary | ICD-10-CM | POA: Insufficient documentation

## 2015-12-31 DIAGNOSIS — H33023 Retinal detachment with multiple breaks, bilateral: Secondary | ICD-10-CM | POA: Insufficient documentation

## 2016-01-01 ENCOUNTER — Encounter: Payer: Self-pay | Admitting: Physician Assistant

## 2016-01-07 DIAGNOSIS — N281 Cyst of kidney, acquired: Secondary | ICD-10-CM | POA: Diagnosis not present

## 2016-01-07 DIAGNOSIS — N401 Enlarged prostate with lower urinary tract symptoms: Secondary | ICD-10-CM | POA: Diagnosis not present

## 2016-02-06 DIAGNOSIS — J343 Hypertrophy of nasal turbinates: Secondary | ICD-10-CM | POA: Diagnosis not present

## 2016-02-06 DIAGNOSIS — H6123 Impacted cerumen, bilateral: Secondary | ICD-10-CM | POA: Diagnosis not present

## 2016-02-06 DIAGNOSIS — R0981 Nasal congestion: Secondary | ICD-10-CM | POA: Diagnosis not present

## 2016-03-05 DIAGNOSIS — L820 Inflamed seborrheic keratosis: Secondary | ICD-10-CM | POA: Diagnosis not present

## 2016-04-15 ENCOUNTER — Other Ambulatory Visit: Payer: Self-pay | Admitting: Physician Assistant

## 2016-04-27 DIAGNOSIS — H04123 Dry eye syndrome of bilateral lacrimal glands: Secondary | ICD-10-CM | POA: Diagnosis not present

## 2016-04-27 DIAGNOSIS — Z961 Presence of intraocular lens: Secondary | ICD-10-CM | POA: Diagnosis not present

## 2016-04-27 DIAGNOSIS — H348192 Central retinal vein occlusion, unspecified eye, stable: Secondary | ICD-10-CM | POA: Diagnosis not present

## 2016-04-27 DIAGNOSIS — H33023 Retinal detachment with multiple breaks, bilateral: Secondary | ICD-10-CM | POA: Diagnosis not present

## 2016-04-27 DIAGNOSIS — B0052 Herpesviral keratitis: Secondary | ICD-10-CM | POA: Diagnosis not present

## 2016-04-27 DIAGNOSIS — H6123 Impacted cerumen, bilateral: Secondary | ICD-10-CM | POA: Diagnosis not present

## 2016-05-05 DIAGNOSIS — M79674 Pain in right toe(s): Secondary | ICD-10-CM | POA: Diagnosis not present

## 2016-05-05 DIAGNOSIS — M2041 Other hammer toe(s) (acquired), right foot: Secondary | ICD-10-CM | POA: Diagnosis not present

## 2016-05-05 DIAGNOSIS — M2042 Other hammer toe(s) (acquired), left foot: Secondary | ICD-10-CM | POA: Diagnosis not present

## 2016-05-05 DIAGNOSIS — M79675 Pain in left toe(s): Secondary | ICD-10-CM | POA: Diagnosis not present

## 2016-05-05 DIAGNOSIS — B351 Tinea unguium: Secondary | ICD-10-CM | POA: Diagnosis not present

## 2016-05-20 DIAGNOSIS — H6123 Impacted cerumen, bilateral: Secondary | ICD-10-CM | POA: Diagnosis not present

## 2016-06-28 DIAGNOSIS — E039 Hypothyroidism, unspecified: Secondary | ICD-10-CM | POA: Diagnosis not present

## 2016-06-28 DIAGNOSIS — N401 Enlarged prostate with lower urinary tract symptoms: Secondary | ICD-10-CM | POA: Diagnosis not present

## 2016-06-28 DIAGNOSIS — G3184 Mild cognitive impairment, so stated: Secondary | ICD-10-CM | POA: Diagnosis not present

## 2016-06-28 DIAGNOSIS — M109 Gout, unspecified: Secondary | ICD-10-CM | POA: Diagnosis not present

## 2016-07-02 DIAGNOSIS — I10 Essential (primary) hypertension: Secondary | ICD-10-CM | POA: Diagnosis not present

## 2016-07-02 DIAGNOSIS — R600 Localized edema: Secondary | ICD-10-CM | POA: Diagnosis not present

## 2016-07-02 DIAGNOSIS — Z87891 Personal history of nicotine dependence: Secondary | ICD-10-CM | POA: Diagnosis not present

## 2016-07-02 DIAGNOSIS — Z043 Encounter for examination and observation following other accident: Secondary | ICD-10-CM | POA: Diagnosis not present

## 2016-07-02 DIAGNOSIS — G9340 Encephalopathy, unspecified: Secondary | ICD-10-CM | POA: Diagnosis not present

## 2016-07-02 DIAGNOSIS — R4182 Altered mental status, unspecified: Secondary | ICD-10-CM | POA: Diagnosis not present

## 2016-07-02 DIAGNOSIS — E876 Hypokalemia: Secondary | ICD-10-CM | POA: Diagnosis not present

## 2016-07-02 DIAGNOSIS — S0081XA Abrasion of other part of head, initial encounter: Secondary | ICD-10-CM | POA: Diagnosis not present

## 2016-07-02 DIAGNOSIS — F0390 Unspecified dementia without behavioral disturbance: Secondary | ICD-10-CM | POA: Diagnosis not present

## 2016-07-02 DIAGNOSIS — S80211A Abrasion, right knee, initial encounter: Secondary | ICD-10-CM | POA: Diagnosis not present

## 2016-07-02 DIAGNOSIS — Z9181 History of falling: Secondary | ICD-10-CM | POA: Diagnosis not present

## 2016-07-03 DIAGNOSIS — R600 Localized edema: Secondary | ICD-10-CM | POA: Diagnosis not present

## 2016-07-03 DIAGNOSIS — E876 Hypokalemia: Secondary | ICD-10-CM | POA: Diagnosis not present

## 2016-07-03 DIAGNOSIS — I10 Essential (primary) hypertension: Secondary | ICD-10-CM | POA: Diagnosis not present

## 2016-07-03 DIAGNOSIS — F0390 Unspecified dementia without behavioral disturbance: Secondary | ICD-10-CM | POA: Diagnosis not present

## 2016-07-04 DIAGNOSIS — F0390 Unspecified dementia without behavioral disturbance: Secondary | ICD-10-CM | POA: Diagnosis not present

## 2016-07-21 DIAGNOSIS — Z515 Encounter for palliative care: Secondary | ICD-10-CM | POA: Diagnosis not present

## 2016-07-21 DIAGNOSIS — Z0000 Encounter for general adult medical examination without abnormal findings: Secondary | ICD-10-CM | POA: Diagnosis not present

## 2016-07-21 DIAGNOSIS — E876 Hypokalemia: Secondary | ICD-10-CM | POA: Diagnosis not present

## 2016-07-21 DIAGNOSIS — E039 Hypothyroidism, unspecified: Secondary | ICD-10-CM | POA: Diagnosis not present

## 2016-07-21 DIAGNOSIS — E538 Deficiency of other specified B group vitamins: Secondary | ICD-10-CM | POA: Diagnosis not present

## 2016-07-21 DIAGNOSIS — D649 Anemia, unspecified: Secondary | ICD-10-CM | POA: Diagnosis not present

## 2016-07-21 DIAGNOSIS — M109 Gout, unspecified: Secondary | ICD-10-CM | POA: Diagnosis not present

## 2016-07-23 ENCOUNTER — Other Ambulatory Visit: Payer: Self-pay | Admitting: Physician Assistant

## 2016-08-12 DIAGNOSIS — L570 Actinic keratosis: Secondary | ICD-10-CM | POA: Diagnosis not present

## 2016-08-12 DIAGNOSIS — L820 Inflamed seborrheic keratosis: Secondary | ICD-10-CM | POA: Diagnosis not present

## 2016-08-26 DIAGNOSIS — N401 Enlarged prostate with lower urinary tract symptoms: Secondary | ICD-10-CM | POA: Diagnosis not present

## 2016-08-26 DIAGNOSIS — N281 Cyst of kidney, acquired: Secondary | ICD-10-CM | POA: Diagnosis not present

## 2016-08-26 DIAGNOSIS — N138 Other obstructive and reflux uropathy: Secondary | ICD-10-CM | POA: Diagnosis not present

## 2016-09-01 DIAGNOSIS — G3184 Mild cognitive impairment, so stated: Secondary | ICD-10-CM | POA: Diagnosis not present

## 2016-09-01 DIAGNOSIS — E039 Hypothyroidism, unspecified: Secondary | ICD-10-CM | POA: Diagnosis not present

## 2016-09-01 DIAGNOSIS — R110 Nausea: Secondary | ICD-10-CM | POA: Diagnosis not present

## 2016-09-01 DIAGNOSIS — I10 Essential (primary) hypertension: Secondary | ICD-10-CM | POA: Diagnosis not present

## 2016-10-05 DIAGNOSIS — R110 Nausea: Secondary | ICD-10-CM | POA: Diagnosis not present

## 2016-10-28 DIAGNOSIS — H6123 Impacted cerumen, bilateral: Secondary | ICD-10-CM | POA: Diagnosis not present

## 2016-11-16 DIAGNOSIS — H6123 Impacted cerumen, bilateral: Secondary | ICD-10-CM | POA: Diagnosis not present

## 2016-12-07 DIAGNOSIS — Z600 Problems of adjustment to life-cycle transitions: Secondary | ICD-10-CM | POA: Diagnosis not present

## 2017-02-22 DIAGNOSIS — H6123 Impacted cerumen, bilateral: Secondary | ICD-10-CM | POA: Diagnosis not present

## 2017-04-12 DIAGNOSIS — F0390 Unspecified dementia without behavioral disturbance: Secondary | ICD-10-CM | POA: Diagnosis not present

## 2017-04-12 DIAGNOSIS — R05 Cough: Secondary | ICD-10-CM | POA: Diagnosis not present

## 2017-04-12 DIAGNOSIS — I10 Essential (primary) hypertension: Secondary | ICD-10-CM | POA: Diagnosis not present

## 2017-04-12 DIAGNOSIS — Z6824 Body mass index (BMI) 24.0-24.9, adult: Secondary | ICD-10-CM | POA: Diagnosis not present

## 2017-04-12 DIAGNOSIS — J309 Allergic rhinitis, unspecified: Secondary | ICD-10-CM | POA: Diagnosis not present

## 2017-10-24 ENCOUNTER — Other Ambulatory Visit: Payer: Self-pay | Admitting: Physician Assistant

## 2017-10-25 ENCOUNTER — Other Ambulatory Visit: Payer: Self-pay | Admitting: Physician Assistant

## 2019-01-25 DEATH — deceased
